# Patient Record
Sex: Male | Born: 1970
Health system: Southern US, Community
[De-identification: ages and names within clinical notes are randomized; demographics above are authoritative.]

## PROBLEM LIST (undated history)

## (undated) DIAGNOSIS — M1990 Unspecified osteoarthritis, unspecified site: Secondary | ICD-10-CM

## (undated) DIAGNOSIS — J301 Allergic rhinitis due to pollen: Secondary | ICD-10-CM

## (undated) DIAGNOSIS — G453 Amaurosis fugax: Secondary | ICD-10-CM

## (undated) DIAGNOSIS — M519 Unspecified thoracic, thoracolumbar and lumbosacral intervertebral disc disorder: Secondary | ICD-10-CM

## (undated) DIAGNOSIS — G4733 Obstructive sleep apnea (adult) (pediatric): Secondary | ICD-10-CM

## (undated) DIAGNOSIS — M722 Plantar fascial fibromatosis: Secondary | ICD-10-CM

## (undated) DIAGNOSIS — M109 Gout, unspecified: Secondary | ICD-10-CM

## (undated) DIAGNOSIS — R5382 Chronic fatigue, unspecified: Secondary | ICD-10-CM

## (undated) DIAGNOSIS — M159 Polyosteoarthritis, unspecified: Secondary | ICD-10-CM

## (undated) DIAGNOSIS — E781 Pure hyperglyceridemia: Secondary | ICD-10-CM

## (undated) HISTORY — DX: Amaurosis fugax: G45.3

## (undated) HISTORY — DX: Unspecified thoracic, thoracolumbar and lumbosacral intervertebral disc disorder: M51.9

## (undated) HISTORY — DX: Chronic fatigue, unspecified: R53.82

## (undated) HISTORY — PX: COLONOSCOPY: SHX174

## (undated) HISTORY — DX: Plantar fascial fibromatosis: M72.2

## (undated) HISTORY — DX: Unspecified osteoarthritis, unspecified site: M19.90

## (undated) HISTORY — DX: Pure hyperglyceridemia: E78.1

## (undated) HISTORY — DX: Allergic rhinitis due to pollen: J30.1

## (undated) HISTORY — DX: Obstructive sleep apnea (adult) (pediatric): G47.33

## (undated) HISTORY — DX: Gout, unspecified: M10.9

## (undated) HISTORY — DX: Polyosteoarthritis, unspecified: M15.9

---

## 1998-06-23 ENCOUNTER — Ambulatory Visit (HOSPITAL_COMMUNITY): Admission: RE | Admit: 1998-06-23 | Discharge: 1998-06-23 | Payer: Self-pay | Admitting: Urology

## 1999-05-24 HISTORY — PX: APPENDECTOMY: SHX54

## 1999-06-03 ENCOUNTER — Ambulatory Visit (HOSPITAL_COMMUNITY): Admission: RE | Admit: 1999-06-03 | Discharge: 1999-06-03 | Payer: Self-pay | Admitting: Family Medicine

## 1999-06-03 ENCOUNTER — Encounter: Payer: Self-pay | Admitting: Family Medicine

## 1999-06-03 ENCOUNTER — Observation Stay (HOSPITAL_COMMUNITY): Admission: EM | Admit: 1999-06-03 | Discharge: 1999-06-04 | Payer: Self-pay | Admitting: *Deleted

## 2000-05-22 ENCOUNTER — Encounter: Admission: RE | Admit: 2000-05-22 | Discharge: 2000-05-22 | Payer: Self-pay | Admitting: Otolaryngology

## 2000-05-22 ENCOUNTER — Encounter: Payer: Self-pay | Admitting: Otolaryngology

## 2008-04-30 ENCOUNTER — Emergency Department (HOSPITAL_COMMUNITY): Admission: EM | Admit: 2008-04-30 | Discharge: 2008-04-30 | Payer: Self-pay | Admitting: Emergency Medicine

## 2011-01-21 ENCOUNTER — Encounter: Payer: Self-pay | Admitting: Gastroenterology

## 2011-02-08 ENCOUNTER — Institutional Professional Consult (permissible substitution) (INDEPENDENT_AMBULATORY_CARE_PROVIDER_SITE_OTHER): Payer: BC Managed Care – PPO | Admitting: Critical Care Medicine

## 2011-02-08 ENCOUNTER — Encounter: Payer: Self-pay | Admitting: Critical Care Medicine

## 2011-02-08 DIAGNOSIS — R5383 Other fatigue: Secondary | ICD-10-CM | POA: Insufficient documentation

## 2011-02-08 DIAGNOSIS — J984 Other disorders of lung: Secondary | ICD-10-CM

## 2011-02-08 DIAGNOSIS — R5381 Other malaise: Secondary | ICD-10-CM | POA: Insufficient documentation

## 2011-02-21 NOTE — Assessment & Plan Note (Signed)
Summary: Pulmonary Consultation   Copy to:  Dr. Candie Echevaria Primary Provider/Referring Provider:  Dr. Candie Echevaria  CC:  Pulmonary Consult - abnormal CT and lung nodule.Marland Kitchen  History of Present Illness: Pulmonary Consultation  40 yo WM referred for eval of 4mm noncalcified nodule RML incidentally seen on Abd CT Done for eval of fatigue and ? panc CA  Pt noted having having fatigue, initially checked for mono and neg.   Pt noted in Jan 2012 he had headaches  worsening that  started in  Dec and extended  into jan. Pt had a CT abdomen and this noted 4mm nodule RML   Pt is  still fatigued.   Pt   had CT head neg and abd.   Pt with 4mm RML nodule seen non calcified There is no real chest pain,  over the years, a pinching effect.  Pt notes exposure hx  : works as a Curator, Sports administrator, exhaust fumes, torch burning and welding , brake dust.    Preventive Screening-Counseling & Management  Alcohol-Tobacco     Smoking Status: never     Passive Smoke Exposure: yes  Comments: mother smoked,   Current Medications (verified): 1)  None  Allergies (verified): 1)  ! Pcn  Past History:  Past medical, surgical, family and social histories (including risk factors) reviewed, and no changes noted (except as noted below).  Past Medical History: FATIGUE (ICD-780.79)  Past Surgical History: Appendectomy  Family History: Reviewed history and no changes required. emphysema - PGF pancreatic cancer - MGM  Social History: Reviewed history and no changes required. Never smoked married 1 daughter Curator Smoking Status:  never Passive Smoke Exposure:  yes  Review of Systems       The patient complains of acid heartburn, loss of appetite, weight change, headaches, nasal congestion/difficulty breathing through nose, and sneezing.  The patient denies shortness of breath with activity, shortness of breath at rest, productive cough, non-productive cough, coughing up blood, chest pain,  irregular heartbeats, indigestion, abdominal pain, difficulty swallowing, sore throat, tooth/dental problems, itching, ear ache, anxiety, depression, hand/feet swelling, joint stiffness or pain, rash, change in color of mucus, and fever.    Vital Signs:  Patient profile:   40 year old male Height:      71 inches Weight:      232 pounds BMI:     32.47 O2 Sat:      98 % on Room air Temp:     98.4 degrees F oral Pulse rate:   73 / minute BP sitting:   110 / 60  (left arm) Cuff size:   regular  Vitals Entered By: Gweneth Dimitri RN (February 08, 2011 3:27 PM)  O2 Flow:  Room air CC: Pulmonary Consult - abnormal CT, lung nodule. Comments Medications reviewed with patient Daytime contact number verified with patient. Gweneth Dimitri RN  February 08, 2011 3:28 PM    Physical Exam  Additional Exam:  Gen: Pleasant, well-nourished, in no distress,  normal affect ENT: No lesions,  mouth clear,  oropharynx clear, no postnasal drip Neck: No JVD, no TMG, no carotid bruits Lungs: No use of accessory muscles, no dullness to percussion, clear without rales or rhonchi Cardiovascular: RRR, heart sounds normal, no murmur or gallops, no peripheral edema Abdomen: soft and NT, no HSM,  BS normal Musculoskeletal: No deformities, no cyanosis or clubbing Neuro: alert, non focal Skin: Warm, no lesions or rashes    CT of Abdomen  Procedure date:  01/19/2011  Findings:      4 mm RML non calcified lung nodule   Impression & Recommendations:  Problem # 1:  PULMONARY NODULE, RIGHT MIDDLE LOBE (ICD-518.89) Assessment Unchanged 4mm noncalcified RML lung nodule of doubtful significance in a nonsmoking 40 yo WM plan no further CT scans needed pulm f/u is as needed  Orders: New Patient Level III (21308)  Patient Instructions: 1)  No further pulmonary follow up is needed    Appended Document: Pulmonary Consultation fax Candie Echevaria

## 2016-04-12 ENCOUNTER — Telehealth: Payer: Self-pay | Admitting: Internal Medicine

## 2016-04-12 NOTE — Telephone Encounter (Signed)
Ok with me, but please let pt know that I do not treat chronic pain, so I would not normally be able to prescribe sched 2 or higher narcotic (but this may not even be an issue for him, I was just letting him know)

## 2016-04-12 NOTE — Telephone Encounter (Signed)
He called to advise that his mother in low, [place], referred you to him as a good dr. He is requesting to be taken under your care. Is this something that you are able to do?

## 2016-04-24 ENCOUNTER — Other Ambulatory Visit (INDEPENDENT_AMBULATORY_CARE_PROVIDER_SITE_OTHER): Payer: BLUE CROSS/BLUE SHIELD

## 2016-04-24 ENCOUNTER — Ambulatory Visit (INDEPENDENT_AMBULATORY_CARE_PROVIDER_SITE_OTHER): Payer: BLUE CROSS/BLUE SHIELD | Admitting: Internal Medicine

## 2016-04-24 ENCOUNTER — Encounter: Payer: Self-pay | Admitting: Internal Medicine

## 2016-04-24 VITALS — BP 122/80 | HR 89 | Temp 98.6°F | Resp 20 | Wt 232.0 lb

## 2016-04-24 DIAGNOSIS — M109 Gout, unspecified: Secondary | ICD-10-CM

## 2016-04-24 DIAGNOSIS — E781 Pure hyperglyceridemia: Secondary | ICD-10-CM | POA: Insufficient documentation

## 2016-04-24 DIAGNOSIS — Z0001 Encounter for general adult medical examination with abnormal findings: Secondary | ICD-10-CM | POA: Diagnosis not present

## 2016-04-24 DIAGNOSIS — M10072 Idiopathic gout, left ankle and foot: Secondary | ICD-10-CM

## 2016-04-24 DIAGNOSIS — R7989 Other specified abnormal findings of blood chemistry: Secondary | ICD-10-CM

## 2016-04-24 DIAGNOSIS — R6889 Other general symptoms and signs: Secondary | ICD-10-CM | POA: Diagnosis not present

## 2016-04-24 DIAGNOSIS — J301 Allergic rhinitis due to pollen: Secondary | ICD-10-CM | POA: Insufficient documentation

## 2016-04-24 DIAGNOSIS — R350 Frequency of micturition: Secondary | ICD-10-CM | POA: Diagnosis not present

## 2016-04-24 DIAGNOSIS — J069 Acute upper respiratory infection, unspecified: Secondary | ICD-10-CM

## 2016-04-24 DIAGNOSIS — M1990 Unspecified osteoarthritis, unspecified site: Secondary | ICD-10-CM | POA: Insufficient documentation

## 2016-04-24 LAB — CBC WITH DIFFERENTIAL/PLATELET
Basophils Absolute: 0.1 10*3/uL (ref 0.0–0.1)
Basophils Relative: 0.3 % (ref 0.0–3.0)
Eosinophils Absolute: 0.3 10*3/uL (ref 0.0–0.7)
Eosinophils Relative: 1.5 % (ref 0.0–5.0)
HCT: 47.6 % (ref 39.0–52.0)
Hemoglobin: 16.7 g/dL (ref 13.0–17.0)
Lymphocytes Relative: 23.4 % (ref 12.0–46.0)
Lymphs Abs: 4.1 10*3/uL — ABNORMAL HIGH (ref 0.7–4.0)
MCHC: 35 g/dL (ref 30.0–36.0)
MCV: 82.9 fl (ref 78.0–100.0)
Monocytes Absolute: 1 10*3/uL (ref 0.1–1.0)
Monocytes Relative: 5.4 % (ref 3.0–12.0)
Neutro Abs: 12.2 10*3/uL — ABNORMAL HIGH (ref 1.4–7.7)
Neutrophils Relative %: 69.4 % (ref 43.0–77.0)
Platelets: 360 10*3/uL (ref 150.0–400.0)
RBC: 5.75 Mil/uL (ref 4.22–5.81)
RDW: 13.2 % (ref 11.5–15.5)
WBC: 17.6 10*3/uL — ABNORMAL HIGH (ref 4.0–10.5)

## 2016-04-24 LAB — URINALYSIS, ROUTINE W REFLEX MICROSCOPIC
Bilirubin Urine: NEGATIVE
Ketones, ur: NEGATIVE
Leukocytes, UA: NEGATIVE
Nitrite: NEGATIVE
Specific Gravity, Urine: >=1.03 — AB (ref 1.000–1.030)
Total Protein, Urine: NEGATIVE
Urine Glucose: NEGATIVE
Urobilinogen, UA: 0.2 (ref 0.0–1.0)
WBC, UA: NONE SEEN (ref 0–?)
pH: 5 (ref 5.0–8.0)

## 2016-04-24 LAB — HEPATIC FUNCTION PANEL
ALT: 23 U/L (ref 0–53)
AST: 17 U/L (ref 0–37)
Albumin: 4.5 g/dL (ref 3.5–5.2)
Alkaline Phosphatase: 65 U/L (ref 39–117)
Bilirubin, Direct: 0.1 mg/dL (ref 0.0–0.3)
Total Bilirubin: 0.5 mg/dL (ref 0.2–1.2)
Total Protein: 8.1 g/dL (ref 6.0–8.3)

## 2016-04-24 LAB — BASIC METABOLIC PANEL
BUN: 16 mg/dL (ref 6–23)
CO2: 25 mEq/L (ref 19–32)
Calcium: 10.1 mg/dL (ref 8.4–10.5)
Chloride: 103 mEq/L (ref 96–112)
Creatinine, Ser: 1.05 mg/dL (ref 0.40–1.50)
GFR: 80.64 mL/min (ref >60.00)
Glucose, Bld: 97 mg/dL (ref 70–99)
Potassium: 4.1 mEq/L (ref 3.5–5.1)
Sodium: 137 mEq/L (ref 135–145)

## 2016-04-24 LAB — LIPID PANEL
Cholesterol: 151 mg/dL (ref 0–200)
HDL: 32.1 mg/dL — ABNORMAL LOW (ref >39.00)
NonHDL: 118.9
Total CHOL/HDL Ratio: 5
Triglycerides: 271 mg/dL — ABNORMAL HIGH (ref 0.0–149.0)
VLDL: 54.2 mg/dL — ABNORMAL HIGH (ref 0.0–40.0)

## 2016-04-24 LAB — URIC ACID: Uric Acid, Serum: 6.7 mg/dL (ref 4.0–7.8)

## 2016-04-24 MED ORDER — PREDNISONE 10 MG PO TABS: ORAL_TABLET | ORAL | Status: DC

## 2016-04-24 MED ORDER — COLCHICINE 0.6 MG PO TABS: 0.6000 mg | ORAL_TABLET | Freq: Every day | ORAL | Status: DC

## 2016-04-24 NOTE — Patient Instructions (Addendum)
You had the steroid shot today  Please take all new medication as prescribed - the prednisone, and the colchicine  Please continue all other medications as before, and refills have been done if requested.  Please have the pharmacy call with any other refills you may need.  Please continue your efforts at being more active, low cholesterol diet, and weight control.  You are otherwise up to date with prevention measures today.  Please keep your appointments with your specialists as you may have planned  Please go to the LAB in the Basement (turn left off the elevator) for the tests to be done today  You will be contacted by phone if any changes need to be made immediately.  Otherwise, you will receive a letter about your results with an explanation, but please check with MyChart first.  Please remember to sign up for MyChart if you have not done so, as this will be important to you in the future with finding out test results, communicating by private email, and scheduling acute appointments online when needed.  You are given the work note today  Please call for urology referral if the urinary symptoms continue and we are unable to find the reason with the testing  Please return in 1 year for your yearly visit, or sooner if needed, with Lab testing done 3-5 days before

## 2016-04-24 NOTE — Progress Notes (Signed)
Subjective:    Patient ID: Michael Hickman, male    DOB: 05/16/1971, 45 y.o.   MRN: [id number]  HPI  Here for wellness and f/u;  Overall doing ok;  Pt denies Chest pain, worsening SOB, DOE, wheezing, orthopnea, PND, worsening LE edema, palpitations, dizziness or syncope.  Pt denies neurological change such as new headache, facial or extremity weakness.  Pt denies polydipsia, polyuria, or low sugar symptoms. Pt states overall good compliance with treatment and medications, good tolerability, and has been trying to follow appropriate diet.  Pt denies worsening depressive symptoms, suicidal ideation or panic. No fever, night sweats, wt loss, loss of appetite, or other constitutional symptoms.  Pt states good ability with ADL's, has low fall risk, home safety reviewed and adequate, no other significant changes in hearing or vision, and occasionally active with exercise.  Incidentally with URI symptoms since last wk, seen at [facility] and tx with antibx but did not take. Still with fever, facial pain, pressure, headache, general weakness and malaise, and greenish d/c, with mild ST and cough.  Also here with left ankle pain/red/swelling start suddenly 4 days ago, similar to previous gout attacks, without fever, trauma.  This is overall 4th episode in 6 yrs.  Also mentions urinary freq, mainly at night, sometimes assoc with showers and brushing teeth, sometime small amounts, sometimes larger. Denies urinary symptoms such as dysuria, urgency, flank pain, hematuria or n/v, fever, chills. Past Medical History  Diagnosis Date  . Arthritis   . Hay fever   . UTI (urinary tract infection)   . Hypertriglyceridemia   . Gout    Past Surgical History  Procedure Laterality Date  . Appendectomy  05/24/1999    reports that he has never smoked. He does not have any smokeless tobacco history on file. He reports that he does not drink alcohol or use illicit drugs. family history includes Multiple sclerosis in his maternal  grandfather; Pancreatic cancer in his maternal grandmother. Allergies  Allergen Reactions  . Penicillins    No current outpatient prescriptions on file prior to visit.   No current facility-administered medications on file prior to visit.   Review of Systems Constitutional: Negative for increased diaphoresis, or other activity, appetite or siginficant weight change other than noted HENT: Negative for worsening hearing loss, ear pain, facial swelling, mouth sores and neck stiffness.   Eyes: Negative for other worsening pain, redness or visual disturbance.  Respiratory: Negative for choking or stridor Cardiovascular: Negative for other chest pain and palpitations.  Gastrointestinal: Negative for worsening diarrhea, blood in stool, or abdominal distention Genitourinary: Negative for hematuria, flank pain or change in urine volume.  Musculoskeletal: Negative for myalgias or other joint complaints.  Skin: Negative for other color change and wound or drainage.  Neurological: Negative for syncope and numbness. other than noted Hematological: Negative for adenopathy. or other swelling Psychiatric/Behavioral: Negative for hallucinations, SI, self-injury, decreased concentration or other worsening agitation.      Objective:   Physical Exam BP 122/80 mmHg  Pulse 89  Temp(Src) 98.6 F (37 C) (Oral)  Resp 20  Wt 232 lb (105.235 kg)  SpO2 95% VS noted, mild ill Constitutional: Pt is oriented to person, place, and time. Appears well-developed and well-nourished, in no significant distress Head: Normocephalic and atraumatic  Eyes: Conjunctivae and EOM are normal. Pupils are equal, round, and reactive to light Right Ear: External ear normal.  Left Ear: External ear normal Nose: Nose normal.  Bilat tm's with mild erythema.  Max sinus  areas non tender.  Pharynx with mild erythema, no exudate Mouth/Throat: Oropharynx is clear and moist  Neck: Normal range of motion. Neck supple. No JVD present.  No tracheal deviation present or significant neck LA or mass Cardiovascular: Normal rate, regular rhythm, normal heart sounds and intact distal pulses.   Pulmonary/Chest: Effort normal and breath sounds without rales or wheezing  Abdominal: Soft. Bowel sounds are normal. NT. No HSM  Musculoskeletal: Normal range of motion. Exhibits no edema. Left ankle with 1-2+ swelling/tender/mild erythema Lymphadenopathy: Has no cervical adenopathy.  Neurological: Pt is alert and oriented to person, place, and time. Pt has normal reflexes. No cranial nerve deficit. Motor grossly intact Skin: Skin is warm and dry. No rash noted or new ulcers Psychiatric:  Has normal mood and affect. Behavior is normal.     Assessment & Plan:

## 2016-04-24 NOTE — Progress Notes (Signed)
Pre visit review using our clinic review tool, if applicable. No additional management support is needed unless otherwise documented below in the visit note. 

## 2016-04-25 ENCOUNTER — Telehealth: Payer: Self-pay | Admitting: Internal Medicine

## 2016-04-25 ENCOUNTER — Encounter: Payer: Self-pay | Admitting: Internal Medicine

## 2016-04-25 DIAGNOSIS — J069 Acute upper respiratory infection, unspecified: Secondary | ICD-10-CM | POA: Insufficient documentation

## 2016-04-25 LAB — PSA: PSA: 0.92 ng/mL (ref 0.10–4.00)

## 2016-04-25 LAB — TSH: TSH: 1.45 u[IU]/mL (ref 0.35–4.50)

## 2016-04-25 LAB — LDL CHOLESTEROL, DIRECT: Direct LDL: 84 mg/dL

## 2016-04-25 MED ORDER — AZITHROMYCIN 250 MG PO TABS: ORAL_TABLET | ORAL | Status: DC

## 2016-04-25 NOTE — Telephone Encounter (Signed)
Please advise can we do this 

## 2016-04-25 NOTE — Assessment & Plan Note (Signed)
Etiology unclear, for glc with labs today, UA, and consider urology referral

## 2016-04-25 NOTE — Telephone Encounter (Signed)
Please follow up in regards with patient on labs.  Returning phone call.

## 2016-04-25 NOTE — Assessment & Plan Note (Signed)
Suspect related to metabolic abnormality related to recurrent gout as well, for lower fat diet, consider fenofibrate with lab done

## 2016-04-25 NOTE — Assessment & Plan Note (Signed)

## 2016-04-25 NOTE — Assessment & Plan Note (Signed)
Mild to mod, for depomedrol IM, predpac asd, declines pain med, for colchicine prn future attack to f/u any worsening symptoms or concerns

## 2016-04-25 NOTE — Telephone Encounter (Signed)
rx for zpack done erx

## 2016-04-25 NOTE — Telephone Encounter (Signed)
Patient called and said he can not find the paper rx for the abx he had got form the other doctor. He would like Dr. Jenny Reichmann to send the abx to the pharmacy [facility] on [address] [place], [place] [address]

## 2016-04-25 NOTE — Assessment & Plan Note (Signed)
Mild to mod, for antibx he already has at home,  to f/u any worsening symptoms or concerns

## 2016-04-25 NOTE — Addendum Note (Signed)
Addended by: Biagio Borg on: 04/25/2016 05:21 PM   Modules accepted: Orders

## 2016-04-26 ENCOUNTER — Telehealth: Payer: Self-pay | Admitting: Internal Medicine

## 2016-04-26 MED ORDER — INDOMETHACIN 50 MG PO CAPS: 50.0000 mg | ORAL_CAPSULE | Freq: Three times a day (TID) | ORAL | Status: DC | PRN

## 2016-04-26 NOTE — Telephone Encounter (Signed)
Work note was already given to patient at time of visit

## 2016-04-26 NOTE — Telephone Encounter (Signed)
Patient would also like a call back on lab results for gout.

## 2016-04-26 NOTE — Telephone Encounter (Signed)
Letter faxed. Results not yet back.

## 2016-04-26 NOTE — Addendum Note (Signed)
Addended by: Biagio Borg on: 04/26/2016 12:51 PM   Modules accepted: Orders, Medications

## 2016-04-26 NOTE — Telephone Encounter (Signed)
error 

## 2016-04-26 NOTE — Telephone Encounter (Signed)
Patient states that Dr. Jenny Reichmann told him to call back if he needed additional days out of work.  Patient is going to return to work on May 1st. Faxed to employer at [phone].  Also, patient states Dr. Jenny Reichmann prescribed him a medication for gout. Patient does not know the name of the medication.  States this medication will cost him 300.00.  Patient is requesting a cheaper med to be sent to [facility] on [address] in [place].

## 2016-04-26 NOTE — Telephone Encounter (Signed)
[place] for note as done  Gout blood test was OK (uric acid normal)  Ok for indocin prn - done erx

## 2016-04-26 NOTE — Telephone Encounter (Signed)
Pt called in and said that dr Jenny Reichmann said he would write him a note to be out of work from [employer].   [phone] - fax number  [place] [facility]

## 2018-02-10 ENCOUNTER — Ambulatory Visit (INDEPENDENT_AMBULATORY_CARE_PROVIDER_SITE_OTHER): Payer: BLUE CROSS/BLUE SHIELD | Admitting: Internal Medicine

## 2018-02-10 ENCOUNTER — Other Ambulatory Visit (INDEPENDENT_AMBULATORY_CARE_PROVIDER_SITE_OTHER): Payer: BLUE CROSS/BLUE SHIELD

## 2018-02-10 ENCOUNTER — Encounter: Payer: Self-pay | Admitting: Internal Medicine

## 2018-02-10 ENCOUNTER — Ambulatory Visit (INDEPENDENT_AMBULATORY_CARE_PROVIDER_SITE_OTHER)
Admission: RE | Admit: 2018-02-10 | Discharge: 2018-02-10 | Disposition: A | Discharge status: Home / Self Care | Payer: BLUE CROSS/BLUE SHIELD | Source: Ambulatory Visit | Attending: Internal Medicine | Admitting: Internal Medicine

## 2018-02-10 VITALS — BP 116/78 | HR 84 | Temp 98.2°F | Ht 71.0 in | Wt 235.0 lb

## 2018-02-10 DIAGNOSIS — Z114 Encounter for screening for human immunodeficiency virus [HIV]: Secondary | ICD-10-CM | POA: Diagnosis not present

## 2018-02-10 DIAGNOSIS — R002 Palpitations: Secondary | ICD-10-CM

## 2018-02-10 DIAGNOSIS — G4733 Obstructive sleep apnea (adult) (pediatric): Secondary | ICD-10-CM | POA: Diagnosis not present

## 2018-02-10 DIAGNOSIS — L750 Bromhidrosis: Secondary | ICD-10-CM | POA: Diagnosis not present

## 2018-02-10 DIAGNOSIS — R8290 Unspecified abnormal findings in urine: Secondary | ICD-10-CM

## 2018-02-10 DIAGNOSIS — M519 Unspecified thoracic, thoracolumbar and lumbosacral intervertebral disc disorder: Secondary | ICD-10-CM

## 2018-02-10 DIAGNOSIS — Z0001 Encounter for general adult medical examination with abnormal findings: Secondary | ICD-10-CM | POA: Diagnosis not present

## 2018-02-10 HISTORY — DX: Unspecified thoracic, thoracolumbar and lumbosacral intervertebral disc disorder: M51.9

## 2018-02-10 LAB — BASIC METABOLIC PANEL
BUN: 22 mg/dL (ref 6–23)
CO2: 25 mEq/L (ref 19–32)
Calcium: 9.4 mg/dL (ref 8.4–10.5)
Chloride: 103 mEq/L (ref 96–112)
Creatinine, Ser: 1.06 mg/dL (ref 0.40–1.50)
GFR: 79.15 mL/min (ref >60.00)
Glucose, Bld: 98 mg/dL (ref 70–99)
Potassium: 3.7 mEq/L (ref 3.5–5.1)
Sodium: 139 mEq/L (ref 135–145)

## 2018-02-10 LAB — URINALYSIS, ROUTINE W REFLEX MICROSCOPIC
Bilirubin Urine: NEGATIVE
Ketones, ur: NEGATIVE
Leukocytes, UA: NEGATIVE
Nitrite: NEGATIVE
Specific Gravity, Urine: >=1.03 — AB (ref 1.000–1.030)
Total Protein, Urine: NEGATIVE
Urine Glucose: NEGATIVE
Urobilinogen, UA: 0.2 (ref 0.0–1.0)
WBC, UA: NONE SEEN (ref 0–?)
pH: 5.5 (ref 5.0–8.0)

## 2018-02-10 NOTE — Assessment & Plan Note (Addendum)
ecg reviewed, etiology unclear, pt declines labs today for this,  to f/u any worsening symptoms or concerns  In addition to the time spent performing CPE, I spent an additional 15 minutes face to face,in which greater than 50% of this time was spent in counseling and coordination of care for patient's illness as documented, including the differential dx, treatment, further evaluation and other management of palpitations, urine odor and OSA

## 2018-02-10 NOTE — Assessment & Plan Note (Signed)
Mild, for UA, BMP,  to f/u any worsening symptoms or concerns

## 2018-02-10 NOTE — Patient Instructions (Signed)
Your EKG was OK today  Please continue all other medications as before, and refills have been done if requested.  Please have the pharmacy call with any other refills you may need.  Please continue your efforts at being more active, low cholesterol diet, and weight control.  You are otherwise up to date with prevention measures today.  You will be contacted regarding the referral for: pulmonary  Please keep your appointments with your specialists as you may have planned  Please go to the XRAY Department in the Basement (go straight as you get off the elevator) for the x-ray testing  Please go to the LAB in the Basement (turn left off the elevator) for the tests to be done today  You will be contacted by phone if any changes need to be made immediately.  Otherwise, you will receive a letter about your results with an explanation, but please check with MyChart first.  Please remember to sign up for MyChart if you have not done so, as this will be important to you in the future with finding out test results, communicating by private email, and scheduling acute appointments online when needed.  Please return in 1 year for your yearly visit, or sooner if needed, with Lab testing done 3-5 days before

## 2018-02-10 NOTE — Assessment & Plan Note (Signed)
With possible related symptoms of sob, palp and dizziness ?  For pulm referral

## 2018-02-10 NOTE — Progress Notes (Signed)
Subjective:    Patient ID: Michael Hickman, male    DOB: 1971-04-07, 47 y.o.   MRN: [id number]  HPI Here for wellness and f/u;  Overall doing ok;  Pt denies Chest pain, worsening SOB, DOE, wheezing, orthopnea, PND, worsening LE edema, dizziness or syncope.  Pt denies neurological change such as new headache, facial or extremity weakness.  Pt denies polydipsia, polyuria, or low sugar symptoms. Pt states overall good compliance with treatment and medications, good tolerability, and has been trying to follow appropriate diet.  Pt denies worsening depressive symptoms, suicidal ideation or panic. No fever, night sweats, wt loss, loss of appetite, or other constitutional symptoms.  Pt states good ability with ADL's, has low fall risk, home safety reviewed and adequate, no other significant changes in hearing or vision, and occasionally active with exercise, but not often as he conts to work as Conservation officer, nature.   Also c/o persistent difficulty tolerating CPAP and quit, but willing to restart with new pulmonary.  Also has 2-3 wks night time sweats, palpiations and sob at night for unclear reasons, intermittent, nothing makes better or worse.  Also c/o several days worsening urinary odor, but Denies urinary symptoms such as dysuria, frequency, urgency, flank pain, hematuria or n/v, fever, chills.  Has "full labs" being done next mo per work MD, so does not want labs unless necessary Past Medical History:  Diagnosis Date  . Arthritis   . Gout   . Hay fever   . Hypertriglyceridemia   . Lumbar disc disease 02/10/2018  . UTI (urinary tract infection)    Past Surgical History:  Procedure Laterality Date  . APPENDECTOMY  05/24/1999    reports that  has never smoked. he has never used smokeless tobacco. He reports that he does not drink alcohol or use drugs. family history includes Multiple sclerosis in his maternal grandfather; Pancreatic cancer in his maternal grandmother. Allergies  Allergen Reactions    . Penicillins    No current outpatient medications on file prior to visit.   No current facility-administered medications on file prior to visit.    Review of Systems Constitutional: Negative for other unusual diaphoresis, sweats, appetite or weight changes HENT: Negative for other worsening hearing loss, ear pain, facial swelling, mouth sores or neck stiffness.   Eyes: Negative for other worsening pain, redness or other visual disturbance.  Respiratory: Negative for other stridor or swelling Cardiovascular: Negative for other palpitations or other chest pain  Gastrointestinal: Negative for worsening diarrhea or loose stools, blood in stool, distention or other pain Genitourinary: Negative for hematuria, flank pain or other change in urine volume.  Musculoskeletal: Negative for myalgias or other joint swelling.  Skin: Negative for other color change, or other wound or worsening drainage.  Neurological: Negative for other syncope or numbness. Hematological: Negative for other adenopathy or swelling Psychiatric/Behavioral: Negative for hallucinations, other worsening agitation, SI, self-injury, or new decreased concentration All other system neg per pt    Objective:   Physical Exam BP 116/78   Pulse 84   Temp 98.2 F (36.8 C) (Oral)   Ht 5\' 11"  (1.803 m)   Wt 235 lb (106.6 kg)   SpO2 97%   BMI 32.78 kg/m  VS noted,  Constitutional: Pt is oriented to person, place, and time. Appears well-developed and well-nourished, in no significant distress and comfortable Head: Normocephalic and atraumatic  Eyes: Conjunctivae and EOM are normal. Pupils are equal, round, and reactive to light Right Ear: External ear normal without discharge Left  Ear: External ear normal without discharge Nose: Nose without discharge or deformity Mouth/Throat: Oropharynx is without other ulcerations and moist  Neck: Normal range of motion. Neck supple. No JVD present. No tracheal deviation present or  significant neck LA or mass Cardiovascular: Normal rate, regular rhythm, normal heart sounds and intact distal pulses.   Pulmonary/Chest: WOB normal and breath sounds without rales or wheezing  Abdominal: Soft. Bowel sounds are normal. NT. No HSM  Musculoskeletal: Normal range of motion. Exhibits no edema Lymphadenopathy: Has no other cervical adenopathy.  Neurological: Pt is alert and oriented to person, place, and time. Pt has normal reflexes. No cranial nerve deficit. Motor grossly intact, Gait intact Skin: Skin is warm and dry. No rash noted or new ulcerations Psychiatric:  Has normal mood and affect. Behavior is normal without agitation No other exam findings  ECG today I have personaly interpreted: NSR without ischemia    Assessment & Plan:

## 2018-02-10 NOTE — Assessment & Plan Note (Signed)

## 2018-02-11 ENCOUNTER — Encounter: Payer: Self-pay | Admitting: Internal Medicine

## 2018-02-11 LAB — HIV ANTIBODY (ROUTINE TESTING W REFLEX): HIV 1&2 Ab, 4th Generation: NONREACTIVE

## 2018-04-09 ENCOUNTER — Ambulatory Visit (INDEPENDENT_AMBULATORY_CARE_PROVIDER_SITE_OTHER): Payer: BLUE CROSS/BLUE SHIELD | Admitting: Pulmonary Disease

## 2018-04-09 ENCOUNTER — Encounter: Payer: Self-pay | Admitting: Pulmonary Disease

## 2018-04-09 VITALS — BP 120/80 | HR 84 | Ht 71.0 in | Wt 235.8 lb

## 2018-04-09 DIAGNOSIS — G4733 Obstructive sleep apnea (adult) (pediatric): Secondary | ICD-10-CM

## 2018-04-09 NOTE — Progress Notes (Signed)
   Subjective:    Patient ID: Michael Hickman, male    DOB: May 08, 1971, 47 y.o.   MRN: [id number]  HPI    Review of Systems  Constitutional: Negative for fever and unexpected weight change.  HENT: Positive for sinus pressure. Negative for congestion, dental problem, ear pain, nosebleeds, postnasal drip, rhinorrhea, sneezing, sore throat and trouble swallowing.   Eyes: Negative for redness and itching.  Respiratory: Negative for cough, chest tightness, shortness of breath and wheezing.   Cardiovascular: Negative for palpitations and leg swelling.  Gastrointestinal: Negative for nausea and vomiting.  Genitourinary: Negative for dysuria.  Musculoskeletal: Negative for joint swelling.  Skin: Negative for rash.  Allergic/Immunologic: Positive for environmental allergies. Negative for food allergies and immunocompromised state.  Neurological: Negative for headaches.  Hematological: Does not bruise/bleed easily.  Psychiatric/Behavioral: Negative for dysphoric mood. The patient is not nervous/anxious.        Objective:   Physical Exam        Assessment & Plan:

## 2018-04-09 NOTE — Patient Instructions (Signed)
Will arrange for home sleep study Will call to arrange for follow up after sleep study reviewed  

## 2018-04-09 NOTE — Progress Notes (Signed)
[facility]  Chief Complaint  Patient presents with  . Consult    Sleep consult, woke up gasping for air and with night sweats x3 months ago     Vital signs: BP 120/80 (BP Location: Left Arm, Cuff Size: Normal)   Pulse 84   Ht 5\' 11"  (1.803 m)   Wt 235 lb 12.8 oz (107 kg)   SpO2 97%   BMI 32.89 kg/m   History of Present Illness: Michael Hickman is a 47 y.o. male for evaluation of sleep problems.  His wife has been concerned about his snoring.  He will also stop breathing while asleep.  He has woken up feeling like he is choked, and gets sweaty at night.  His wife is a Copywriter, advertising.  He had an oral appliance made, but couldn't tolerate this.  He then saw pulmonary with [facility] last year.  Had sleep study that showed AHI of 6.  He was started on CPAP.  He never got mask or pressure setting adjusted.  He got frustrated with lack of responsiveness, and turned CPAP in.  His sleep issues have persisted, and he decided to reassess.  He goes to sleep at 9 pm.  He falls asleep 10 minutes.  He wakes up some times to use the bathroom.  He gets out of bed at 6 am.  He feels tired in the morning.  He denies morning headache.  He does not use anything to help him fall sleep or stay awake.  He denies sleep walking, sleep talking, bruxism, or nightmares.  There is no history of restless legs.  He denies sleep hallucinations, sleep paralysis, or cataplexy.  The Epworth score is 5 out of 24.    Physical Exam:  General - pleasant Eyes - pupils reactive ENT - no sinus tenderness, no oral exudate, no LAN, MP 3, low laying soft palate Cardiac - regular, no murmur Chest - no wheeze, rales Abd - soft, non tender Ext - no edema Skin - no rashes Neuro - normal strength Psych - normal mood  Discussion: He has snoring, sleep disruption, apnea, and daytime sleepiness.  He has prior sleep study showing mild sleep apnea, but he had trouble adjusting to CPAP.   His symptoms have progressed.  I suspect he still has sleep apnea.  We discussed how sleep apnea can affect various health problems, including risks for hypertension, cardiovascular disease, and diabetes.  We also discussed how sleep disruption can increase risks for accidents, such as while driving.  Weight loss as a means of improving sleep apnea was also reviewed.  Additional treatment options discussed were CPAP therapy, oral appliance, and surgical intervention.  Assessment/Plan:  Obstructive sleep apnea. - will repeat home sleep study to assess current status - will then likely arrange for CPAP titration in sleep lab to assess optimal pressure setting and help with mask fit - he was intolerant of previous attempt with oral appliance   Patient Instructions  Will arrange for home sleep study Will call to arrange for follow up after sleep study reviewed    Chesley Mires, MD [place] 04/09/2018, 5:28 PM  Flow Sheet  Sleep tests:  Review of Systems: Constitutional: Negative for fever and unexpected weight change.  HENT: Positive for sinus pressure. Negative for congestion, dental problem, ear pain, nosebleeds, postnasal drip, rhinorrhea, sneezing, sore throat and trouble swallowing.   Eyes: Negative for redness and itching.  Respiratory: Negative for cough, chest tightness, shortness of  breath and wheezing.   Cardiovascular: Negative for palpitations and leg swelling.  Gastrointestinal: Negative for nausea and vomiting.  Genitourinary: Negative for dysuria.  Musculoskeletal: Negative for joint swelling.  Skin: Negative for rash.  Allergic/Immunologic: Positive for environmental allergies. Negative for food allergies and immunocompromised state.  Neurological: Negative for headaches.  Hematological: Does not bruise/bleed easily.  Psychiatric/Behavioral: Negative for dysphoric mood. The patient is not nervous/anxious.    Past Medical History: He  has a past  medical history of Arthritis, Gout, Hay fever, Hypertriglyceridemia, Lumbar disc disease (02/10/2018), and UTI (urinary tract infection).  Past Surgical History: He  has a past surgical history that includes Appendectomy (05/24/1999).  Family History: His family history includes Multiple sclerosis in his maternal grandfather; Pancreatic cancer in his maternal grandmother.  Social History: He  reports that he has never smoked. He has never used smokeless tobacco. He reports that he does not drink alcohol or use drugs.  Medications: Allergies as of 04/09/2018      Reactions   Penicillins       Medication List    as of 04/09/2018  5:28 PM   You have not been prescribed any medications.

## 2018-04-28 DIAGNOSIS — G4733 Obstructive sleep apnea (adult) (pediatric): Secondary | ICD-10-CM | POA: Diagnosis not present

## 2018-04-30 ENCOUNTER — Other Ambulatory Visit: Payer: Self-pay | Admitting: *Deleted

## 2018-04-30 DIAGNOSIS — G4733 Obstructive sleep apnea (adult) (pediatric): Secondary | ICD-10-CM

## 2018-05-01 DIAGNOSIS — G4733 Obstructive sleep apnea (adult) (pediatric): Secondary | ICD-10-CM | POA: Diagnosis not present

## 2018-05-02 ENCOUNTER — Telehealth: Payer: Self-pay | Admitting: Pulmonary Disease

## 2018-05-02 DIAGNOSIS — G4733 Obstructive sleep apnea (adult) (pediatric): Secondary | ICD-10-CM

## 2018-05-02 NOTE — Telephone Encounter (Signed)
HST 04/28/18 >> AHI 16.9, SaO2 low 83%   Will have my nurse inform pt that sleep study shows moderate sleep apnea.  Options are 1) CPAP now, 2) ROV first.  If He is agreeable to CPAP, then please send order for auto CPAP range 5 to 15 cm H2O with heated humidity and mask of choice.  Have download sent 1 month after starting CPAP and set up ROV 2 months after starting CPAP.  ROV can be with me or NP.

## 2018-05-05 NOTE — Telephone Encounter (Signed)
Called and spoke with patient regarding results.  Informed the patient of results and recommendations today. Pt verbalized understanding and denied any questions or concerns at this time.  Patient is agreeable to CPAP, placed order today for auto CPAP range 5 to 15 cm H2O with heated humidity and mask of choice.  Have download sent 1 month after starting CPAP and set up ROV 2 months after starting CPAP.   Nothing further needed.

## 2018-09-30 HISTORY — PX: TRANSTHORACIC ECHOCARDIOGRAM: SHX275

## 2018-10-15 ENCOUNTER — Encounter: Payer: Self-pay | Admitting: Internal Medicine

## 2018-10-15 ENCOUNTER — Ambulatory Visit: Payer: BLUE CROSS/BLUE SHIELD | Admitting: Internal Medicine

## 2018-10-15 VITALS — BP 122/78 | HR 87 | Temp 98.5°F | Ht 71.0 in | Wt 237.0 lb

## 2018-10-15 DIAGNOSIS — G4733 Obstructive sleep apnea (adult) (pediatric): Secondary | ICD-10-CM | POA: Diagnosis not present

## 2018-10-15 DIAGNOSIS — H539 Unspecified visual disturbance: Secondary | ICD-10-CM

## 2018-10-15 NOTE — Progress Notes (Signed)
Subjective:    Patient ID: Michael Hickman, male    DOB: [birth year], 47 y.o.   MRN: [id number]  HPI   Here after ED visit per [facility] facility, then a [facility] facility, then a further opthalmologist as pt stated he woke up middle of night 10/14-10/15/2019 with sudden onset complete vision loss in right eye that spontaneously resolved after 15-20 seconds without residual; no pain or flashes of light in either eye; exercise/exertion does not affect acuity; referred him on to other optho with impression:  (partial report here only)  NAME: Michael Hickman DOB: 09-16-1971  IMPRESSION:  1. Transient monocular vision loss OD, recovering spontaneously after 20 seconds on 10/13/2018. High on the differential is vasospasm vs atypical orthostasis, though his history of sleep apnea may be a contributor. Though there is mild prolongation of arm-to-retina circulation time OD, there was no defect seen on studies in ER and there is good pulsatile blood flow on plethysmography. There are no symptoms of Giant Cell Arteritis and his Sed Rate and CRP in ER were normal. He has a paternal history of migraines.  2. Mild lens opacity OU.  PLAN: From an ophthalmic standpoint: he needs no further follow up unless there are new symptoms.  From a systemic standpoint: he is to discuss this with Dr. Jenny Hickman, who he sees tomorrow, to see if they can shed some light on the differential of vasospasm/migraine, orthostasis and sleep apnea.   ________________  Past Retinal Procedures: None here  OCT of the Macula:  Images were ordered by Dr. Tollie Pizza, obtained, stored in patients medical record, and interpreted as showing: liquified vitreous pocket over the macula though attached at disc and elsewhere OU  Fluorescein Angiogram:  Images were ordered by Dr. Tollie Pizza, obtained, stored in patients medical record, and interpreted as showing: mild prolongation arm-to-retina circulation time (26) seconds with normal arteriovenous  transit and perfusion OD; otherwise normal vasculature without leakage OU  Fundus Photography: Images were ordered by Dr. Tollie Pizza, obtained, stored in patients medical record, and interpreted as consistent with the clinical exam of the fundus described in this note.  Pt today without further vision change, HA, fever, ST, cough, and Pt denies chest pain, increased sob or doe, wheezing, orthopnea, PND, increased LE swelling, palpitations, dizziness or syncope. Past Medical History:  Diagnosis Date  . Arthritis   . Gout   . Hay fever   . Hypertriglyceridemia   . Lumbar disc disease 02/10/2018  . UTI (urinary tract infection)    Past Surgical History:  Procedure Laterality Date  . APPENDECTOMY  05/24/1999    reports that he has never smoked. He has never used smokeless tobacco. He reports that he does not drink alcohol or use drugs. family history includes Multiple sclerosis in his maternal grandfather; Pancreatic cancer in his maternal grandmother. Allergies  Allergen Reactions  . Penicillins    No current outpatient medications on file prior to visit.   No current facility-administered medications on file prior to visit.    Review of Systems  Constitutional: Negative for other unusual diaphoresis or sweats HENT: Negative for ear discharge or swelling Eyes: Negative for other worsening visual disturbances Respiratory: Negative for stridor or other swelling  Gastrointestinal: Negative for worsening distension or other blood Genitourinary: Negative for retention or other urinary change Musculoskeletal: Negative for other MSK pain or swelling Skin: Negative for color change or other new lesions Neurological: Negative for worsening tremors and other numbness  Psychiatric/Behavioral: Negative for worsening agitation or other fatigue  All other system neg per pt    Objective:   Physical Exam BP 122/78   Pulse 87   Temp 98.5 F (36.9 C) (Oral)   Ht 5\' 11"  (1.803 m)   Wt 237 lb  (107.5 kg)   SpO2 95%   BMI 33.05 kg/m  VS noted,  Constitutional: Pt appears in NAD HENT: Head: NCAT.  Right Ear: External ear normal.  Left Ear: External ear normal.  Eyes: . Pupils are equal, round, and reactive to light. Conjunctivae and EOM are normal Nose: without d/c or deformity Neck: Neck supple. Gross normal ROM Cardiovascular: Normal rate and regular rhythm.   Pulmonary/Chest: Effort normal and breath sounds without rales or wheezing.  Neurological: Pt is alert. At baseline orientation, motor grossly intact Skin: Skin is warm. No rashes, other new lesions, no LE edema Psychiatric: Pt behavior is normal without agitation  No other exam findings Lab Results  Component Value Date   WBC 17.6 (H) 04/24/2016   HGB 16.7 04/24/2016   HCT 47.6 04/24/2016   PLT 360.0 04/24/2016   GLUCOSE 98 02/10/2018   CHOL 151 04/24/2016   TRIG 271.0 (H) 04/24/2016   HDL 32.10 (L) 04/24/2016   LDLDIRECT 84.0 04/24/2016   ALT 23 04/24/2016   AST 17 04/24/2016   NA 139 02/10/2018   K 3.7 02/10/2018   CL 103 02/10/2018   CREATININE 1.06 02/10/2018   BUN 22 02/10/2018   CO2 25 02/10/2018   TSH 1.45 04/24/2016   PSA 0.92 04/24/2016        Assessment & Plan:

## 2018-10-15 NOTE — Assessment & Plan Note (Signed)
Etiology unclear, not felt to be consistent with amaurosis fugax per specialty optho, denies further symptoms including vision, dizziness.  Has not had CPAP compliance and was frustrated over lack of responsiveness to his request for titration follow up; pt to continue asa 81 mg, also for echo

## 2018-10-15 NOTE — Assessment & Plan Note (Signed)
Declines further f/u for eval and tx for now

## 2018-10-15 NOTE — Patient Instructions (Addendum)
Please continue all other medications as before, including the aspirin 81 mg per day  Please have the pharmacy call with any other refills you may need.  Please continue your efforts at being more active, low cholesterol diet, and weight control.  You are otherwise up to date with prevention measures today.  Please keep your appointments with your specialists as you may have planned  You will be contacted regarding the referral for: Echocardiogram

## 2018-10-17 ENCOUNTER — Inpatient Hospital Stay: Payer: BLUE CROSS/BLUE SHIELD | Admitting: Internal Medicine

## 2018-10-23 ENCOUNTER — Ambulatory Visit (HOSPITAL_COMMUNITY): Payer: BLUE CROSS/BLUE SHIELD | Attending: Internal Medicine

## 2018-10-23 ENCOUNTER — Other Ambulatory Visit: Payer: Self-pay

## 2018-10-23 DIAGNOSIS — H539 Unspecified visual disturbance: Secondary | ICD-10-CM

## 2018-10-25 ENCOUNTER — Encounter: Payer: Self-pay | Admitting: Internal Medicine

## 2018-10-27 ENCOUNTER — Telehealth: Payer: Self-pay

## 2018-10-27 NOTE — Telephone Encounter (Signed)
Pt has been informed of results and expressed understanding.  He would like to know what is the next step to finding out what ever it is he has going on because he is still having symptoms. Please advise.

## 2018-10-27 NOTE — Telephone Encounter (Signed)
-----   Message from Biagio Borg, MD sent at 10/25/2018  3:00 AM EDT ----- [place] Baseman- ok to let pt know by phone his Echo had no significant abnormalities;  Letter to follow when I return Nov 4

## 2018-10-29 NOTE — Telephone Encounter (Signed)
I can refer to neurology if he wants, otherwise I would not have anything further to offer

## 2018-10-29 NOTE — Telephone Encounter (Signed)
Pt has been informed of results and expressed understanding.  He doesn't want to proceed with the referral at this time but if he changes his mind he will call back.

## 2018-11-21 ENCOUNTER — Telehealth: Payer: Self-pay | Admitting: Family Medicine

## 2018-11-21 NOTE — Telephone Encounter (Signed)
Patient is requesting to transfer care from Dr Jenny Reichmann to Dr Anitra Lauth (see reason below). Dr Jenny Reichmann, is this okay with you? Dr. Anitra Lauth, is this okay with you?

## 2018-11-21 NOTE — Telephone Encounter (Signed)
Ok with me 

## 2018-11-21 NOTE — Telephone Encounter (Signed)
Copied from [place] [id number]. Topic: Appointment Scheduling - Transfer of Care >> Nov 21, 2018  3:28 PM Reyne Dumas L wrote: Pt is requesting to transfer FROM: Dr. Jenny Reichmann Pt is requesting to transfer TO: Dr. Anitra Lauth Reason for requested transfer: unhappy with care from Dr. Jenny Reichmann, no call backs from questions, and [facility] is closer location.  Pt's wife calling requesting transfer for pt.  Pt's contact number is [phone]  Send CRM to patient's current PCP (transferring FROM).

## 2018-11-26 NOTE — Telephone Encounter (Signed)
I have reviewed patient's chart. Pt has been seen multiple times over the last year by [facility] by Dr. Jenny Reichmann. He has established relationships with 2 different primary care offices.  I decline to accept him as a patient.

## 2018-11-26 NOTE — Telephone Encounter (Signed)
Patient's wife called to make sure that her husband's care has been transferred from Dr. Jenny Reichmann to Dr. Anitra Lauth.  She stated that her husband is still not feeling well and they have not heard anything as to how soon she could see Dr. Anitra Lauth.  Please advise and let patient know as soon as possible.

## 2018-12-01 ENCOUNTER — Encounter: Payer: Self-pay | Admitting: Family Medicine

## 2018-12-01 NOTE — Telephone Encounter (Signed)
I spoke with Mr. Alpern at length regarding his request to change providers today. He expressed frustration with the communication breakdown based on phone notes from Oct 30th. He states that he was never offered a referral to neurology and no one had called him with that information. He also states he has seen [facility] for urgent care issues and cold like symptoms but he would very willingly agree to have all of his care coordinated through [facility] - if you would be willing to accept him. 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

## 2018-12-01 NOTE — Telephone Encounter (Signed)
OK. Ok to transfer to me. -thx

## 2018-12-02 NOTE — Telephone Encounter (Signed)
Patient has been scheduled 12/17/18.

## 2018-12-02 NOTE — Telephone Encounter (Signed)
Michael Hickman,  Will you call to schedule Michael Hickman please? Thank you!

## 2018-12-17 ENCOUNTER — Encounter: Payer: BLUE CROSS/BLUE SHIELD | Admitting: Family Medicine

## 2018-12-19 ENCOUNTER — Encounter: Payer: Self-pay | Admitting: Family Medicine

## 2018-12-19 ENCOUNTER — Ambulatory Visit: Payer: BLUE CROSS/BLUE SHIELD | Admitting: Family Medicine

## 2018-12-19 ENCOUNTER — Encounter

## 2018-12-19 VITALS — BP 116/76 | HR 97 | Temp 98.8°F | Resp 16 | Ht 71.0 in | Wt 239.1 lb

## 2018-12-19 DIAGNOSIS — E669 Obesity, unspecified: Secondary | ICD-10-CM | POA: Diagnosis not present

## 2018-12-19 DIAGNOSIS — R7989 Other specified abnormal findings of blood chemistry: Secondary | ICD-10-CM | POA: Diagnosis not present

## 2018-12-19 DIAGNOSIS — G4733 Obstructive sleep apnea (adult) (pediatric): Secondary | ICD-10-CM

## 2018-12-19 DIAGNOSIS — R5382 Chronic fatigue, unspecified: Secondary | ICD-10-CM | POA: Diagnosis not present

## 2018-12-19 DIAGNOSIS — H34231 Retinal artery branch occlusion, right eye: Secondary | ICD-10-CM

## 2018-12-19 DIAGNOSIS — H5460 Unqualified visual loss, one eye, unspecified: Secondary | ICD-10-CM

## 2018-12-19 DIAGNOSIS — R6882 Decreased libido: Secondary | ICD-10-CM

## 2018-12-19 NOTE — Progress Notes (Addendum)
Office Note 12/21/2018  CC:  Chief Complaint  Patient presents with  . Transfer of Care    from Dr. Jenny Reichmann  . Follow-up    ER visit for vision loss in right, went to [facility]    HPI:  Michael Hickman is a 47 y.o.  male who is here to establish care, transferring from Dr. Jenny Reichmann at [facility] location.  This location is more convenient to their home. Patient's most recent primary MD: Dr. Jenny Reichmann. Old records in EPIC/[facility] EMR were reviewed prior to or during today's visit.  Six months ago, he started having intermittent periods of (about 1 a week). Was having night sweats, gets anxious about the feeling, can't catch his breath.  Sometimes the sequence is such that he wakes up first with SOB and then panics and starts sweating.   Sleeping in recliner sometimes has been required.    About 2 mo ago, on one occasion he could not see out of R eye at all, had no pain--woke up out of sleep with this in the middle of the night. Slowly the light/vision came back within about 30 sec.  He went to the hospital that night (10/14/18) ([facility] in [place]). He got a w/u for stroke: neg and was d/c'd from the ED w/out being admitted.  Reviewed records today--> Labs all wnl except TSH was just above ULN at 5.11.  CT angio head and neck showed "There is no cervical or intracranial arterial stenosis, dissection or occlusion. There is no aneurysm or vascular malformation."  CT head w/ou contrast showed no acute intracranial abnormality. EKG was normal. He was started on an aspirin qd but he did not start taking this med.  He notes that he felt the best he had in a long time about 2 d after getting out of hospital. He was told by ED MD to get an echo and to see an eye MD. He went to [facility] [place] on 10/16/1 (Dr. Trilby Leaver Hidaji) and was sent to another eye doctor b/c of suspicion of branch retinal artery occlusion of right eye.  He was immediately referred to a retinal specialist in [place] named Meade Maw.  A dye test was done and the flow through a branch retinal artery was borderline slow but there was no defect seen on studies in ER and there is good pulsatile blood flow on plethysmography.  No sx's of Giant Cell Arteritis and his sed rate and CRP in ER were normal.  Mild lens opacity OU.  The ophthalmologist's note states "from an ophthalmic standpoint: he needs no further follow up unless there are new symptoms".  There was mention of vasospasm, ocular migraine, orthostasis, and sleep apnea possibly being etiologies of his sx's. Echo normal on 10/23/18.  He has had no recurrence of the vision loss. He presented to his PCP, Dr. Jenny Reichmann, for f/u.  He was reassured and apparently told nothing new to offer, proceed with watchful waiting approach.  However, patient states he was to be called back with a plan for the next step but never received a call.  There is documentation in EMR from 10/27/18 that Dr. Jenny Reichmann could refer him to a neurologist, but pt chose to defer this at that time.  Pt states today he does not recall that and says he would have "jumped at the opportunity to see a specialist".  ROS: no CP, no SOB, no wheezing, no cough, no dizziness, no HAs, no rashes, no melena/hematochezia.  No polyuria or polydipsia.  No myalgias or arthralgias. No periods of being unaware. Arms fall asleep easy at night.    Past Medical History:  Diagnosis Date  . Arthritis    knees and back and neck  . Chronic fatigue   . Gout   . Hay fever   . Hypertriglyceridemia   . Lumbar disc disease 02/10/2018  . OSA (obstructive sleep apnea)    CPAP in the past but he could not tolerate this; eventually pt gave up on the process.  After 2nd sleep study, he could not afford the CPAP.    Past Surgical History:  Procedure Laterality Date  . APPENDECTOMY  05/24/1999  . TRANSTHORACIC ECHOCARDIOGRAM  09/2018   EF 55-60%, NO ABNORMALITIES    Family History  Problem Relation Age of Onset  . Pancreatic cancer  Maternal Grandmother   . Multiple sclerosis Maternal Grandfather     Social History   Socioeconomic History  . Marital status: Married    Spouse name: Not on file  . Number of children: Not on file  . Years of education: Not on file  . Highest education level: Not on file  Occupational History  . Not on file  Social Needs  . Financial resource strain: Not on file  . Food insecurity:    Worry: Not on file    Inability: Not on file  . Transportation needs:    Medical: Not on file    Non-medical: Not on file  Tobacco Use  . Smoking status: Never Smoker  . Smokeless tobacco: Never Used  Substance and Sexual Activity  . Alcohol use: No    Alcohol/week: 0.0 standard drinks  . Drug use: No  . Sexual activity: Not on file  Lifestyle  . Physical activity:    Days per week: Not on file    Minutes per session: Not on file  . Stress: Not on file  Relationships  . Social connections:    Talks on phone: Not on file    Gets together: Not on file    Attends religious service: Not on file    Active member of club or organization: Not on file    Attends meetings of clubs or organizations: Not on file    Relationship status: Not on file  . Intimate partner violence:    Fear of current or ex partner: Not on file    Emotionally abused: Not on file    Physically abused: Not on file    Forced sexual activity: Not on file  Other Topics Concern  . Not on file  Social History Narrative   Married to [place], 1 daughter.   Orig from [place].   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   No tob.   No alc.     MEDS: none  Allergies  Allergen Reactions  . Penicillins     ROS Review of Systems  Constitutional: Positive for fatigue. Negative for fever.  HENT: Negative for congestion and sore throat.   Eyes: Negative for visual disturbance.  Respiratory: Negative for cough.   Cardiovascular: Negative for chest pain.  Gastrointestinal: Negative for abdominal pain, anal bleeding,  blood in stool, constipation, diarrhea, nausea, rectal pain and vomiting.  Genitourinary: Negative for dysuria, frequency and urgency.       +Decreased Libido  Says no ED.  Musculoskeletal: Negative for back pain and joint swelling.  Skin: Negative for rash.  Neurological: Negative for seizures, weakness, light-headedness and headaches.  Hematological: Negative for adenopathy.  Psychiatric/Behavioral: Negative for confusion, dysphoric mood, sleep disturbance and suicidal ideas. The patient is not nervous/anxious.     PE; Blood pressure 116/76, pulse 97, temperature 98.8 F (37.1 C), temperature source Oral, resp. rate 16, height '5\' 11"'  (1.803 m), weight 239 lb 2 oz (108.5 kg), SpO2 96 %. Body mass index is 33.35 kg/m.  Gen: Alert, well appearing.  Patient is oriented to person, place, time, and situation. AFFECT: pleasant, lucid thought and speech. TSV:XBLT: no injection, icteris, swelling, or exudate.  EOMI, PERRLA. Mouth: lips without lesion/swelling.  Oral mucosa pink and moist. Oropharynx without erythema, exudate, or swelling.  Neck - No masses or thyromegaly or limitation in range of motion CV: RRR, no m/r/g.   LUNGS: CTA bilat, nonlabored resps, good aeration in all lung fields. EXT: no clubbing or cyanosis.  no edema.  Neuro: CN 2-12 intact bilaterally, strength 5/5 in proximal and distal upper extremities and lower extremities bilaterally.  No sensory deficits.  No tremor.  No disdiadochokinesis.  No ataxia.  Upper extremity and lower extremity DTRs symmetric.  No pronator drift. Visual field testing normal.  No nystagmus.   Pertinent labs:     Chemistry      Component Value Date/Time   NA 139 02/10/2018 1644   K 3.7 02/10/2018 1644   CL 103 02/10/2018 1644   CO2 25 02/10/2018 1644   BUN 22 02/10/2018 1644   CREATININE 1.06 02/10/2018 1644      Component Value Date/Time   CALCIUM 9.4 02/10/2018 1644   ALKPHOS 65 04/24/2016 1704   AST 17 04/24/2016 1704   ALT 23  04/24/2016 1704   BILITOT 0.5 04/24/2016 1704     Lab Results  Component Value Date   WBC 17.6 (H) 04/24/2016   HGB 16.7 04/24/2016   HCT 47.6 04/24/2016   MCV 82.9 04/24/2016   PLT 360.0 04/24/2016   Lab Results  Component Value Date   TSH 1.45 04/24/2016   Lab Results  Component Value Date   CHOL 151 04/24/2016   HDL 32.10 (L) 04/24/2016   LDLDIRECT 84.0 04/24/2016   TRIG 271.0 (H) 04/24/2016   CHOLHDL 5 04/24/2016    ASSESSMENT AND PLAN:   New/transfer pt:  1) Transient monocular (OD) vision loss.  Throrough blood and imaging workup unrevealing except a possible right branch retinal artery occlusion on initial ophthalmologist exam.  F/u exam by retinal specialist the next day shows NO sign of any obstruction of blood flow/thrombosis.   Vasospasm, ocular migraine, simple partial seizure, atypical effect of untreated OSA?   I told him to start taking 81 mg ASA qd and I'll review his records again in detail.  If no new info revealed, will refer him to a neurologist.  In the meantime, will obtain 48H holter monitor to try to capture asymptomatic a-fib. Will also recheck a lab panel: CBC w/diff, CMET, TSH, T4, T3, ESR, CRP, testosterone total and free, LH, FSH, cortisol, and prolactin levels. I won't check hypercoagulability studies at this time.  2) Mildly elevated TSH: likely of NO clinical significance.  However, with his low libido and potential for low testosterone level, will repeat this level and T4 and T3, along with other pituitary blood tests.  3) OSA, untreated.  Difficult to tell whether or not this is even effecting him-->he is fairly evasive about this subject.  He gives not indication that he will ever pursue trial of CPAP again.  Fortunately he currently is doing well, just feeling very fatigued.  Spent  60 min with pt today, with >50% of this time spent in counseling and care coordination regarding the above problems.  An After Visit Summary was printed and  given to the patient.  Return in about 10 days (around 12/29/2018) for 30 min f/u R eye vision loss.  Signed:  Crissie Sickles, MD           12/21/2018

## 2018-12-21 ENCOUNTER — Encounter: Payer: Self-pay | Admitting: Family Medicine

## 2018-12-21 ENCOUNTER — Telehealth: Payer: Self-pay | Admitting: Family Medicine

## 2018-12-21 NOTE — Addendum Note (Signed)
Addended by: Tammi Sou on: 12/21/2018 06:50 PM   Modules accepted: Orders

## 2018-12-21 NOTE — Telephone Encounter (Signed)
Chart thoroughly reviewed. No additional imaging is indicated at this time. I do want him to get 48 hour continuous monitoring of his heart rhythm to make sure an abnormal rhythm is not causing high risk of blood clots.  I have ordered this 48 hr holter and he'll be getting contacted about it. As long as all his blood tests are unrevealing, I will refer him to a neurologist for further evaluation.  We'll wait until all blood tests are back before ordering this referral.-thx

## 2018-12-22 ENCOUNTER — Other Ambulatory Visit (INDEPENDENT_AMBULATORY_CARE_PROVIDER_SITE_OTHER): Payer: BLUE CROSS/BLUE SHIELD

## 2018-12-22 DIAGNOSIS — R5382 Chronic fatigue, unspecified: Secondary | ICD-10-CM

## 2018-12-22 DIAGNOSIS — R6882 Decreased libido: Secondary | ICD-10-CM | POA: Diagnosis not present

## 2018-12-22 DIAGNOSIS — R7989 Other specified abnormal findings of blood chemistry: Secondary | ICD-10-CM

## 2018-12-22 DIAGNOSIS — H5460 Unqualified visual loss, one eye, unspecified: Secondary | ICD-10-CM

## 2018-12-22 LAB — CBC WITH DIFFERENTIAL/PLATELET
Basophils Absolute: 0.1 10*3/uL (ref 0.0–0.1)
Basophils Relative: 0.9 % (ref 0.0–3.0)
Eosinophils Absolute: 0.2 10*3/uL (ref 0.0–0.7)
Eosinophils Relative: 1.9 % (ref 0.0–5.0)
HCT: 45.5 % (ref 39.0–52.0)
Hemoglobin: 15.8 g/dL (ref 13.0–17.0)
Lymphocytes Relative: 33.9 % (ref 12.0–46.0)
Lymphs Abs: 2.9 10*3/uL (ref 0.7–4.0)
MCHC: 34.6 g/dL (ref 30.0–36.0)
MCV: 84.9 fl (ref 78.0–100.0)
Monocytes Absolute: 0.5 10*3/uL (ref 0.1–1.0)
Monocytes Relative: 6.2 % (ref 3.0–12.0)
Neutro Abs: 5 10*3/uL (ref 1.4–7.7)
Neutrophils Relative %: 57.1 % (ref 43.0–77.0)
Platelets: 266 10*3/uL (ref 150.0–400.0)
RBC: 5.37 Mil/uL (ref 4.22–5.81)
RDW: 13.5 % (ref 11.5–15.5)
WBC: 8.7 10*3/uL (ref 4.0–10.5)

## 2018-12-22 LAB — COMPREHENSIVE METABOLIC PANEL
ALT: 29 U/L (ref 0–53)
AST: 18 U/L (ref 0–37)
Albumin: 4.2 g/dL (ref 3.5–5.2)
Alkaline Phosphatase: 54 U/L (ref 39–117)
BUN: 15 mg/dL (ref 6–23)
CO2: 25 mEq/L (ref 19–32)
Calcium: 9.2 mg/dL (ref 8.4–10.5)
Chloride: 107 mEq/L (ref 96–112)
Creatinine, Ser: 1.03 mg/dL (ref 0.40–1.50)
GFR: 81.52 mL/min (ref >60.00)
Glucose, Bld: 92 mg/dL (ref 70–99)
Potassium: 4.3 mEq/L (ref 3.5–5.1)
Sodium: 139 mEq/L (ref 135–145)
Total Bilirubin: 0.6 mg/dL (ref 0.2–1.2)
Total Protein: 7.2 g/dL (ref 6.0–8.3)

## 2018-12-22 LAB — C-REACTIVE PROTEIN: CRP: 0.2 mg/dL — ABNORMAL LOW (ref 0.5–20.0)

## 2018-12-22 LAB — TSH: TSH: 1.65 u[IU]/mL (ref 0.35–4.50)

## 2018-12-22 LAB — PROTIME-INR
INR: 1 ratio (ref 0.8–1.0)
Prothrombin Time: 11.8 s (ref 9.6–13.1)

## 2018-12-22 LAB — LUTEINIZING HORMONE: LH: 2.67 m[IU]/mL (ref 1.50–9.30)

## 2018-12-22 LAB — SEDIMENTATION RATE: Sed Rate: 15 mm/hr (ref 0–15)

## 2018-12-22 LAB — FOLLICLE STIMULATING HORMONE: FSH: 2.7 m[IU]/mL (ref 1.4–18.1)

## 2018-12-22 LAB — T4, FREE: Free T4: 0.86 ng/dL (ref 0.60–1.60)

## 2018-12-22 NOTE — Telephone Encounter (Signed)
Pt advised and voiced understanding.   

## 2018-12-23 LAB — RPR: RPR Ser Ql: NONREACTIVE

## 2018-12-23 LAB — HIV ANTIBODY (ROUTINE TESTING W REFLEX): HIV 1&2 Ab, 4th Generation: NONREACTIVE

## 2018-12-23 LAB — TESTOSTERONE TOTAL,FREE,BIO, MALES
Albumin: 4.1 g/dL (ref 3.6–5.1)
Sex Hormone Binding: 32 nmol/L (ref 10–50)
Testosterone, Bioavailable: 116.9 ng/dL (ref >=110.0)
Testosterone, Free: 62.1 pg/mL (ref 46.0–224.0)
Testosterone: 444 ng/dL (ref 250–827)

## 2018-12-23 LAB — T3: T3, Total: 174 ng/dL (ref 76–181)

## 2018-12-23 LAB — PROLACTIN: Prolactin: 5.5 ng/mL (ref 2.0–18.0)

## 2018-12-24 ENCOUNTER — Other Ambulatory Visit: Payer: Self-pay | Admitting: Family Medicine

## 2018-12-24 DIAGNOSIS — H53131 Sudden visual loss, right eye: Secondary | ICD-10-CM

## 2018-12-24 DIAGNOSIS — H539 Unspecified visual disturbance: Secondary | ICD-10-CM

## 2018-12-24 NOTE — Progress Notes (Signed)
Orders only

## 2018-12-25 ENCOUNTER — Encounter: Payer: Self-pay | Admitting: Neurology

## 2018-12-30 ENCOUNTER — Encounter: Payer: Self-pay | Admitting: Family Medicine

## 2018-12-30 ENCOUNTER — Ambulatory Visit: Payer: BLUE CROSS/BLUE SHIELD | Admitting: Family Medicine

## 2018-12-30 VITALS — BP 96/61 | HR 74 | Temp 98.5°F | Resp 16 | Ht 71.0 in | Wt 238.2 lb

## 2018-12-30 DIAGNOSIS — H539 Unspecified visual disturbance: Secondary | ICD-10-CM | POA: Diagnosis not present

## 2018-12-30 NOTE — Progress Notes (Signed)
OFFICE VISIT  12/30/2018   CC:  Chief Complaint  Patient presents with  . Follow-up    vision loss in right eye     HPI:    Patient is a 47 y.o. Caucasian male who presents for 9 day f/u transient R eye vision loss. Last visit I referred him to a neurologist and did a panel of blood tests, all of which were entirely normal (CBC, CMET, TSH, T4 and T3, ESR, CRP, testosterone, LH, cortisol, and prolactin. I also ordered a 48 H holter monitor.  Interim hx: No recurrence of vision abnormality and no new symptom.  His only complaint is still some mild chronic fatigue. He has appt in March with a neurologist. He is in the midst of setting up the Holter monitor.   Past Medical History:  Diagnosis Date  . Arthritis    knees and back and neck  . Chronic fatigue   . Gout   . Hay fever   . Hypertriglyceridemia   . Lumbar disc disease 02/10/2018  . OSA (obstructive sleep apnea)    CPAP in the past but he could not tolerate this; eventually pt gave up on the process.  After 2nd sleep study, he could not afford the CPAP.    Past Surgical History:  Procedure Laterality Date  . APPENDECTOMY  05/24/1999  . COLONOSCOPY  ? approx 2017   part of w/u for abd pain per pt's best recollection (Dr. Mann?)-->? polyps?  pt cannot recall any details  . TRANSTHORACIC ECHOCARDIOGRAM  09/2018   EF 55-60%, NO ABNORMALITIES    Outpatient Medications Prior to Visit  Medication Sig Dispense Refill  . aspirin EC 81 MG tablet Take 81 mg by mouth daily.     No facility-administered medications prior to visit.     Allergies  Allergen Reactions  . Penicillins     ROS As per HPI  PE: Blood pressure 96/61, pulse 74, temperature 98.5 F (36.9 C), temperature source Oral, resp. rate 16, height '5\' 11"'  (1.803 m), weight 238 lb 4 oz (108.1 kg), SpO2 94 %. Gen: Alert, well appearing.  Patient is oriented to person, place, time, and situation. AFFECT: pleasant, lucid thought and speech. No further  exam today.  LABS:  None today  Lab Results  Component Value Date   TSH 1.65 12/22/2018   Lab Results  Component Value Date   WBC 8.7 12/22/2018   HGB 15.8 12/22/2018   HCT 45.5 12/22/2018   MCV 84.9 12/22/2018   PLT 266.0 12/22/2018   Lab Results  Component Value Date   CREATININE 1.03 12/22/2018   BUN 15 12/22/2018   NA 139 12/22/2018   K 4.3 12/22/2018   CL 107 12/22/2018   CO2 25 12/22/2018   Lab Results  Component Value Date   ALT 29 12/22/2018   AST 18 12/22/2018   ALKPHOS 54 12/22/2018   BILITOT 0.6 12/22/2018   Lab Results  Component Value Date   ESRSEDRATE 15 12/22/2018   Lab Results  Component Value Date   CRP 0.2 (L) 12/22/2018   Lab Results  Component Value Date   TESTOSTERONE 444 12/22/2018    IMPRESSION AND PLAN:  Transient R eye vision loss. Question of branch retinal artery occlusion when seen by ophthalmologist.  Retinal specialist saw him and saw NO abnormality, though. No recurrence of symptoms. Next step is eval by neurologist, pt has appt in March 2020. Also, 48H holter to be set up any time now--pt has to call them  back. Signs/symptoms to call or return for were reviewed and pt expressed understanding.  Spent 15 min with pt today, with >50% of this time spent in counseling and care coordination regarding the above problems.  An After Visit Summary was printed and given to the patient.  FOLLOW UP: Return for to be determined based on result of w/u and neuro eval.  Signed:  Crissie Sickles, MD           12/30/2018

## 2018-12-31 DIAGNOSIS — G453 Amaurosis fugax: Secondary | ICD-10-CM

## 2018-12-31 HISTORY — DX: Amaurosis fugax: G45.3

## 2018-12-31 HISTORY — PX: OTHER SURGICAL HISTORY: SHX169

## 2019-01-14 ENCOUNTER — Other Ambulatory Visit: Payer: Self-pay | Admitting: Family Medicine

## 2019-01-14 ENCOUNTER — Ambulatory Visit (INDEPENDENT_AMBULATORY_CARE_PROVIDER_SITE_OTHER): Payer: BLUE CROSS/BLUE SHIELD

## 2019-01-14 DIAGNOSIS — H34231 Retinal artery branch occlusion, right eye: Secondary | ICD-10-CM

## 2019-01-14 DIAGNOSIS — R5383 Other fatigue: Secondary | ICD-10-CM

## 2019-01-14 DIAGNOSIS — R0602 Shortness of breath: Secondary | ICD-10-CM

## 2019-01-14 DIAGNOSIS — H5460 Unqualified visual loss, one eye, unspecified: Secondary | ICD-10-CM

## 2019-01-14 DIAGNOSIS — I4891 Unspecified atrial fibrillation: Secondary | ICD-10-CM

## 2019-01-21 ENCOUNTER — Encounter: Payer: Self-pay | Admitting: Family Medicine

## 2019-01-23 ENCOUNTER — Telehealth: Payer: Self-pay | Admitting: Pulmonary Disease

## 2019-01-23 DIAGNOSIS — G4733 Obstructive sleep apnea (adult) (pediatric): Secondary | ICD-10-CM

## 2019-01-23 NOTE — Telephone Encounter (Signed)
Patient returned call, CB is [phone].

## 2019-01-23 NOTE — Telephone Encounter (Signed)
I called and spoke with the patient and advise that we could place the order but we are uncertain if they may require a new sleep test but we would try. He verbalized understanding   I have placed but nothing further is needed at this time.

## 2019-01-23 NOTE — Telephone Encounter (Signed)
Pt is calling back [phone]

## 2019-01-23 NOTE — Telephone Encounter (Signed)
The order was sent on 05/18/2018 and I believe he will have to start the process over again because insurance may not pay for the CPAP. ATC pt, no answer. Left message for pt to call back.

## 2019-01-23 NOTE — Telephone Encounter (Signed)
[email or web address]

## 2019-02-27 NOTE — Progress Notes (Signed)
NEUROLOGY CONSULTATION NOTE  PER BEAGLEY MRN: [id number] DOB: 02-07-1971  Referring provider: Shawnie Dapper, MD Primary care provider: Shawnie Dapper, MD  Reason for consult:  Transient right monocular vision loss  HISTORY OF PRESENT ILLNESS: Michael Hickman is a 48 year old right-handed Caucasian man with obstructive sleep apnea who presents for transient right monocular vision loss.  History supplemented by ED and referring provider notes.  On the morning of 10/14/2018, patient woke up and sat up in bed.  He felt something wrong but could not describe it.  When he opened his eyes he had complete vision loss in the right eye that lasted 10 to 15 seconds and vision gradually returned.  He denied associated ocular pain, positive visual phenomena, headache, dizziness, slurred speech, facial droop or unilateral numbness or weakness.  He went to the ED at [facility] where CT of head was negative for acute abnormality.  CTA of the head and neck revealed no emergent large vessel occlusion or other vascular abnormality.  ANA negative, sed rate 6, CRP 2.90.  TSH was mildly elevated at 5.11.  He was discharged on ASA 325mg  daily.  He was evaluated by ophthalmology who found mild prolongation of arm-to-retina circulation time in right eye, but overall eye exam was unremarkable with good pulsatile flow on plethysmography.  Vasospasm versus atypical orthostasis or related to uncontrolled OSA were suspected.  Echocardiogram from 10/23/18 was negative with EF 55-60%.  48 hour holter monitor in January 2020 demonstrated no arrhythmia.  He has since discontinued the aspirin.  No recurrence episodes since then.  He feels sluggish.  Once in a while he might see a brief speck of light in his peripheral vision.    He denies history of migraines.    PAST MEDICAL HISTORY: Past Medical History:  Diagnosis Date  . Arthritis    knees and back and neck  . Chronic fatigue   . Gout    . Hay fever   . Hypertriglyceridemia   . Lumbar disc disease 02/10/2018  . OSA (obstructive sleep apnea)    CPAP in the past but he could not tolerate this; eventually pt gave up on the process.  After 2nd sleep study, he could not afford the CPAP.    PAST SURGICAL HISTORY: Past Surgical History:  Procedure Laterality Date  . APPENDECTOMY  05/24/1999  . COLONOSCOPY  ? approx 2017   part of w/u for abd pain per pt's best recollection (Dr. Mann?)-->? polyps?  pt cannot recall any details  . HOLTER MONITOR 48H  12/2018   NORMAL  . TRANSTHORACIC ECHOCARDIOGRAM  09/2018   EF 55-60%, NO ABNORMALITIES    MEDICATIONS: Current Outpatient Medications on File Prior to Visit  Medication Sig Dispense Refill  . aspirin EC 81 MG tablet Take 81 mg by mouth daily.     No current facility-administered medications on file prior to visit.     ALLERGIES: Allergies  Allergen Reactions  . Penicillins     FAMILY HISTORY: Family History  Problem Relation Age of Onset  . Pancreatic cancer Maternal Grandmother   . Multiple sclerosis Maternal Grandfather    SOCIAL HISTORY: Social History   Socioeconomic History  . Marital status: Married    Spouse name: Not on file  . Number of children: Not on file  . Years of education: Not on file  . Highest education level: Not on file  Occupational History  . Not on file  Social Needs  . Financial resource strain:  Not on file  . Food insecurity:    Worry: Not on file    Inability: Not on file  . Transportation needs:    Medical: Not on file    Non-medical: Not on file  Tobacco Use  . Smoking status: Never Smoker  . Smokeless tobacco: Never Used  Substance and Sexual Activity  . Alcohol use: No    Alcohol/week: 0.0 standard drinks  . Drug use: No  . Sexual activity: Not on file  Lifestyle  . Physical activity:    Days per week: Not on file    Minutes per session: Not on file  . Stress: Not on file  Relationships  . Social  connections:    Talks on phone: Not on file    Gets together: Not on file    Attends religious service: Not on file    Active member of club or organization: Not on file    Attends meetings of clubs or organizations: Not on file    Relationship status: Not on file  . Intimate partner violence:    Fear of current or ex partner: Not on file    Emotionally abused: Not on file    Physically abused: Not on file    Forced sexual activity: Not on file  Other Topics Concern  . Not on file  Social History Narrative   Married to [place], 1 daughter.   Orig from [place].   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   No tob.   No alc.      REVIEW OF SYSTEMS: Constitutional: No fevers, chills, or sweats, no generalized fatigue, change in appetite Eyes: No visual changes, double vision, eye pain Ear, nose and throat: No hearing loss, ear pain, nasal congestion, sore throat Cardiovascular: No chest pain, palpitations Respiratory:  No shortness of breath at rest or with exertion, wheezes GastrointestinaI: No nausea, vomiting, diarrhea, abdominal pain, fecal incontinence Genitourinary:  No dysuria, urinary retention or frequency Musculoskeletal:  No neck pain, back pain Integumentary: No rash, pruritus, skin lesions Neurological: as above Psychiatric: No depression, insomnia, anxiety Endocrine: No palpitations, fatigue, diaphoresis, mood swings, change in appetite, change in weight, increased thirst Hematologic/Lymphatic:  No purpura, petechiae. Allergic/Immunologic: no itchy/runny eyes, nasal congestion, recent allergic reactions, rashes  PHYSICAL EXAM: Blood pressure 110/64, pulse 99, height 5\' 11"  (1.803 m), weight 243 lb (110.2 kg), SpO2 93 %. General: No acute distress.  Patient appears well-groomed.   Head:  Normocephalic/atraumatic Eyes:  fundi examined but not visualized Neck: supple, no paraspinal tenderness, full range of motion Back: No paraspinal tenderness Heart: regular rate  and rhythm Lungs: Clear to auscultation bilaterally. Vascular: No carotid bruits. Neurological Exam: Mental status: alert and oriented to person, place, and time, recent and remote memory intact, fund of knowledge intact, attention and concentration intact, speech fluent and not dysarthric, language intact. Cranial nerves: CN I: not tested CN II: pupils equal, round and reactive to light, visual fields intact CN III, IV, VI:  full range of motion, no nystagmus, no ptosis CN V: facial sensation intact CN VII: upper and lower face symmetric CN VIII: hearing intact CN IX, X: gag intact, uvula midline CN XI: sternocleidomastoid and trapezius muscles intact CN XII: tongue midline Bulk & Tone: normal, no fasciculations. Motor:  5/5 throughout  Sensation: temperature and vibration sensation intact.   Deep Tendon Reflexes:  2+ throughout, toes downgoing.   Finger to nose testing:  Without dysmetria.   Heel to shin:  Without dysmetria.   Gait:  Normal station and stride.  Able to turn.  Romberg negative.  IMPRESSION: 1.  Amaurosis fugax of the right eye.  I have no other explanation.  He has no history of migraines, no associated headache or positive visual phenomena to suggest ocular migraine.  OSA is a risk factor for stroke.    PLAN: 1.  Recommend taking aspirin 81mg  daily 2.  Management of sleep apnea 3.  Follow up as needed.  Thank you for allowing me to take part in the care of this patient.  Metta Clines, DO  CC: Shawnie Dapper, MD

## 2019-03-02 ENCOUNTER — Encounter: Payer: Self-pay | Admitting: Neurology

## 2019-03-02 ENCOUNTER — Ambulatory Visit: Payer: BLUE CROSS/BLUE SHIELD | Admitting: Neurology

## 2019-03-02 VITALS — BP 110/64 | HR 99 | Ht 71.0 in | Wt 243.0 lb

## 2019-03-02 DIAGNOSIS — G453 Amaurosis fugax: Secondary | ICD-10-CM

## 2019-03-02 NOTE — Patient Instructions (Signed)
I think you had a small blood clot to the eye. Recommend taking aspirin 81mg  daily Follow up as needed

## 2019-04-05 ENCOUNTER — Encounter: Payer: Self-pay | Admitting: Family Medicine

## 2019-08-23 IMAGING — DX DG CHEST 2V
2 series · 2 of 2 positions shown · non-contrast
Comparison: 05/08/2010 thoracic spine

CLINICAL DATA: night sweats with palp and sob nightly, CPAP non
compliant

EXAM:
CHEST  2 VIEW

[chest pa]
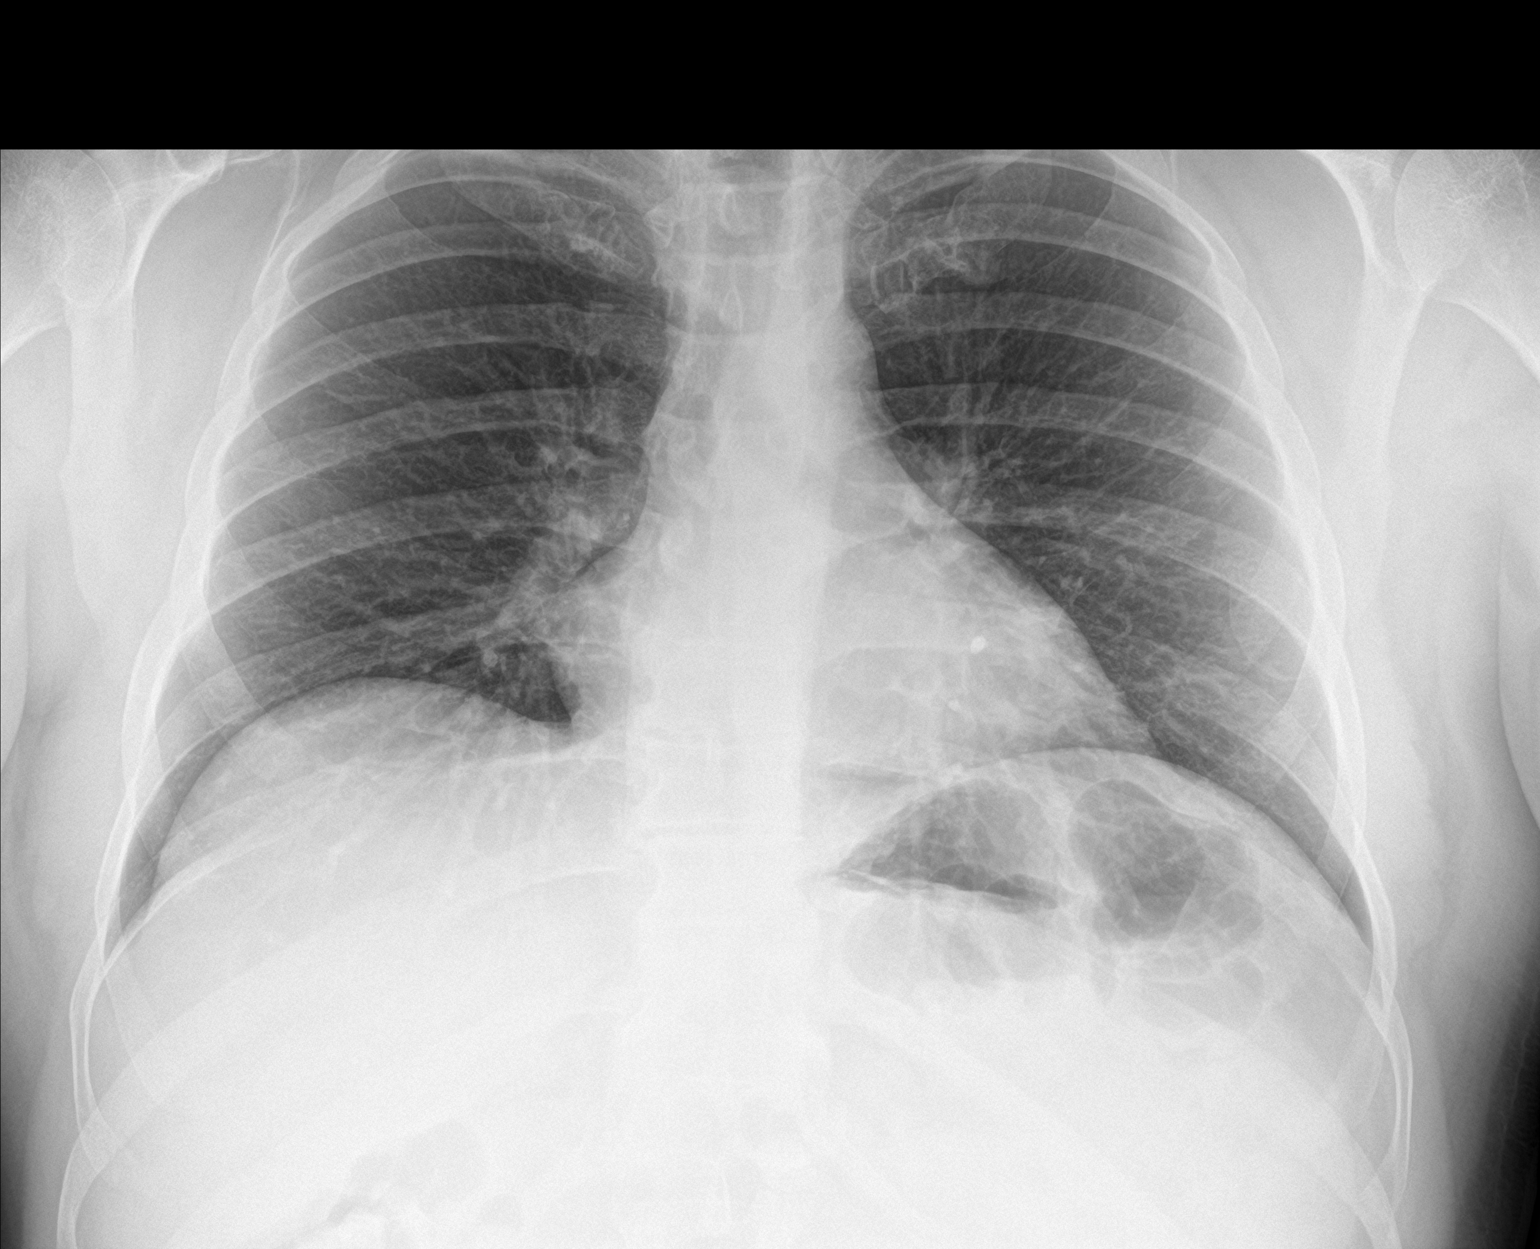

[chest lat]
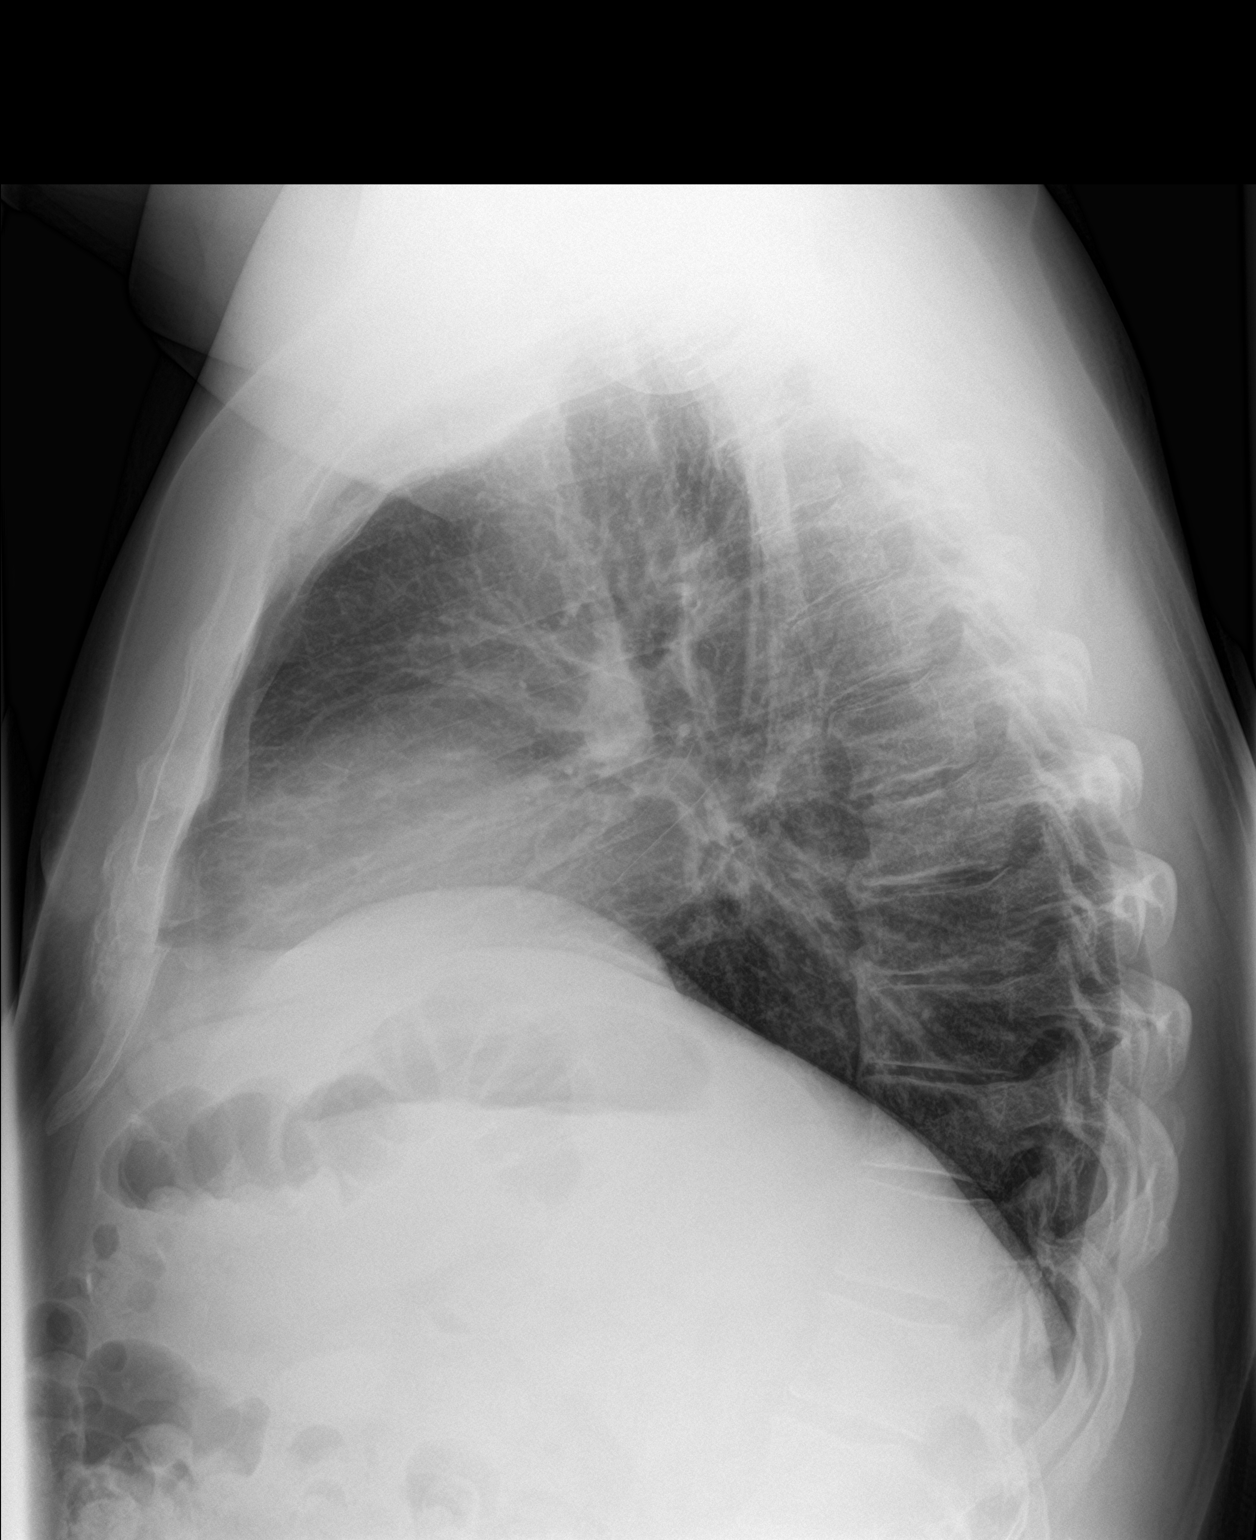

[2 of 2 positions shown; findings below may reference images not displayed]

FINDINGS: Normal heart size. Lungs clear. No pneumothorax. No pleural
effusion.
IMPRESSION: No active cardiopulmonary disease.

## 2019-12-11 ENCOUNTER — Other Ambulatory Visit: Payer: Self-pay

## 2019-12-11 DIAGNOSIS — Z20822 Contact with and (suspected) exposure to covid-19: Secondary | ICD-10-CM

## 2019-12-12 LAB — NOVEL CORONAVIRUS, NAA: SARS-CoV-2, NAA: NOT DETECTED

## 2020-08-05 ENCOUNTER — Other Ambulatory Visit: Payer: Self-pay

## 2020-08-08 ENCOUNTER — Encounter: Payer: Self-pay | Admitting: Family Medicine

## 2020-08-08 ENCOUNTER — Ambulatory Visit (INDEPENDENT_AMBULATORY_CARE_PROVIDER_SITE_OTHER): Payer: 59 | Admitting: Family Medicine

## 2020-08-08 ENCOUNTER — Other Ambulatory Visit: Payer: Self-pay

## 2020-08-08 VITALS — BP 116/81 | HR 94 | Resp 18 | Ht 71.0 in | Wt 234.6 lb

## 2020-08-08 DIAGNOSIS — E669 Obesity, unspecified: Secondary | ICD-10-CM

## 2020-08-08 DIAGNOSIS — Z125 Encounter for screening for malignant neoplasm of prostate: Secondary | ICD-10-CM | POA: Diagnosis not present

## 2020-08-08 DIAGNOSIS — R5382 Chronic fatigue, unspecified: Secondary | ICD-10-CM

## 2020-08-08 DIAGNOSIS — R6882 Decreased libido: Secondary | ICD-10-CM | POA: Diagnosis not present

## 2020-08-08 NOTE — Progress Notes (Signed)
OFFICE VISIT  08/08/2020   CC:  Chief Complaint  Patient presents with  . Fatigue    onset: gradual , severe fatigue .    HPI:    Patient is a 49 y.o.  male with hx of about 3 mo of fatigue, OSA, osteoarthritis of multiple sites, and gout who presents for Excessive sleepiness biggest problem.  Hits him about mid day most of the time.  Some days a lot worse than others.  No signif pain as a part of this.  No HAs. Has had signif dec libido, occ erectile prob but nothing persistent. Has hx of OSA, got frustrated b/c he could not get "anyone to adjust it" so he was noncompliant. Historically he would wake up gasping but says this has not been much of a problem lately. He goes to bed about 9 pm, usually no problem getting to sleep, some nighttime awakening but no prolonged awakening.  Up at 6 pm. No otc meds. No alc or drug use. 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  ROS: no fevers, no CP, no SOB, no wheezing, no cough, no dizziness, no HAs, no rashes, no melena/hematochezia.  No polyuria or polydipsia.  No myalgias or arthralgias.  No focal weakness, paresthesias, or tremors.  No acute vision or hearing abnormalities. No n/v/d or abd pain.  No palpitations.    Denies depression or anxiety.   Pt also requests prostate ca screening today.  He declines rectal exam.  Past Medical History:  Diagnosis Date  . Amaurosis fugax of right eye 2020   Transient monocular vision loss--brief.  W/u unremarkable.  Neurologist rec'd 55m ASA qd and good control of OSA.  .Marland KitchenArthritis    knees and back and neck  . Chronic fatigue   . Gout   . Hay fever   . Hypertriglyceridemia   . Lumbar disc disease 02/10/2018  . OSA (obstructive sleep apnea)    CPAP in the past but he could not tolerate this; eventually pt gave up on the process.  After 2nd sleep study, he could not afford the CPAP.      Past Surgical History:  Procedure Laterality Date  . APPENDECTOMY  05/24/1999  .  COLONOSCOPY  ? approx 2017   part of w/u for abd pain per pt's best recollection (Dr. Mann?)-->? polyps?  pt cannot recall any details  . HOLTER MONITOR 48H  12/2018   NORMAL  . TRANSTHORACIC ECHOCARDIOGRAM  09/2018   EF 55-60%, NO ABNORMALITIES   Social History   Socioeconomic History  . Marital status: Married    Spouse name: MValaria Good . Number of children: 1  . Years of education: Not on file  . Highest education level: Associate degree: occupational, tHotel manager or vocational program  Occupational History  . Occupation: mMusic therapist [employer]  Tobacco Use  . Smoking status: Never Smoker  . Smokeless tobacco: Never Used  Vaping Use  . Vaping Use: Never used  Substance and Sexual Activity  . Alcohol use: No    Alcohol/week: 0.0 standard drinks  . Drug use: No  . Sexual activity: Not on file  Other Topics Concern  . Not on file  Social History Narrative   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   No tob.   No alc.        Patient is right-handed. He lives with his wife  in a two level home. He drinks one cup of coffee, and one large glass of tea a day, he does not exercise.   Social Determinants of Health   Financial Resource Strain:   . Difficulty of Paying Living Expenses:   Food Insecurity:   . Worried About Charity fundraiser in the Last Year:   . Arboriculturist in the Last Year:   Transportation Needs:   . Film/video editor (Medical):   Marland Kitchen Lack of Transportation (Non-Medical):   Physical Activity:   . Days of Exercise per Week:   . Minutes of Exercise per Session:   Stress:   . Feeling of Stress :   Social Connections:   . Frequency of Communication with Friends and Family:   . Frequency of Social Gatherings with Friends and Family:   . Attends Religious Services:   . Active Member of Clubs or Organizations:   . Attends Archivist Meetings:   Marland Kitchen  Marital Status:      Outpatient Medications Prior to Visit  Medication Sig Dispense Refill  . albuterol (VENTOLIN HFA) 108 (90 Base) MCG/ACT inhaler Inhale into the lungs. (Patient not taking: Reported on 08/08/2020)     No facility-administered medications prior to visit.    Allergies  Allergen Reactions  . Penicillins     ROS As per HPI  PE: Vitals with BMI 08/08/2020 03/02/2019 12/30/2018  Height '5\' 11"'  '5\' 11"'  '5\' 11"'   Weight 234 lbs 10 oz 243 lbs 238 lbs 4 oz  BMI 32.73 42.35 36.14  Systolic 431 540 96  Diastolic 81 64 61  Pulse 94 99 74  O2 sat on RA today is 96%  Gen: Alert, well appearing.  Patient is oriented to person, place, time, and situation. AFFECT: pleasant, lucid thought and speech. ENT: Ears: EACs clear, normal epithelium.  TMs with good light reflex and landmarks bilaterally.  Eyes: no injection, icteris, swelling, or exudate.  EOMI, PERRLA. Nose: no drainage or turbinate edema/swelling.  No injection or focal lesion.  Mouth: lips without lesion/swelling.  Oral mucosa pink and moist.  Dentition intact and without obvious caries or gingival swelling.  Oropharynx without erythema, exudate, or swelling.  Neck: supple/nontender.  No LAD, mass, or TM.  Carotid pulses 2+ bilaterally, without bruits. CV: RRR, no m/r/g.   LUNGS: CTA bilat, nonlabored resps, good aeration in all lung fields. ABD: soft, NT, ND, BS normal.  No hepatospenomegaly or mass.  No bruits. EXT: no clubbing, cyanosis, or edema.  Musculoskeletal: no joint swelling, erythema, warmth, or tenderness.  ROM of all joints intact. Skin - no sores or suspicious lesions or rashes or color changes Rectal: pt declined   LABS:  Lab Results  Component Value Date   TSH 1.65 12/22/2018   Lab Results  Component Value Date   WBC 8.7 12/22/2018   HGB 15.8 12/22/2018   HCT 45.5 12/22/2018   MCV 84.9 12/22/2018   PLT 266.0 12/22/2018   Lab Results  Component Value Date   CREATININE 1.03 12/22/2018   BUN  15 12/22/2018   NA 139 12/22/2018   K 4.3 12/22/2018   CL 107 12/22/2018   CO2 25 12/22/2018   Lab Results  Component Value Date   ALT 29 12/22/2018   AST 18 12/22/2018   ALKPHOS 54 12/22/2018   BILITOT 0.6 12/22/2018   Lab Results  Component Value Date   CHOL 151 04/24/2016   Lab Results  Component Value Date   HDL  32.10 (L) 04/24/2016   No results found for: [facility] Lab Results  Component Value Date   TRIG 271.0 (H) 04/24/2016   Lab Results  Component Value Date   CHOLHDL 5 04/24/2016   Lab Results  Component Value Date   PSA 0.92 04/24/2016   Lab Results  Component Value Date   LABURIC 6.7 04/24/2016   IMPRESSION AND PLAN:  Fatigue, subacute.  History of similar in the past. No red flags for cardiopulmonary cause. Has OSA that he has not been able to tolerate CPAP for.  This could be a problem causing current sx's. We'll do some blood work: CBC, CMET, ESR, TSH, testosterone, hba1c.  Prostate ca screening: he declined DRE.  PSA drawn today.  An After Visit Summary was printed and given to the patient.  FOLLOW UP: Return for f/u to be determined based on results of blood testing.  Signed:  Crissie Sickles, MD           08/08/2020

## 2020-08-09 LAB — COMPREHENSIVE METABOLIC PANEL
ALT: 21 U/L (ref 0–53)
AST: 17 U/L (ref 0–37)
Albumin: 4.3 g/dL (ref 3.5–5.2)
Alkaline Phosphatase: 60 U/L (ref 39–117)
BUN: 15 mg/dL (ref 6–23)
CO2: 26 mEq/L (ref 19–32)
Calcium: 9.3 mg/dL (ref 8.4–10.5)
Chloride: 106 mEq/L (ref 96–112)
Creatinine, Ser: 1.27 mg/dL (ref 0.40–1.50)
GFR: 59.83 mL/min — ABNORMAL LOW (ref >60.00)
Glucose, Bld: 96 mg/dL (ref 70–99)
Potassium: 3.8 mEq/L (ref 3.5–5.1)
Sodium: 141 mEq/L (ref 135–145)
Total Bilirubin: 0.5 mg/dL (ref 0.2–1.2)
Total Protein: 7.2 g/dL (ref 6.0–8.3)

## 2020-08-09 LAB — LIPID PANEL
Cholesterol: 153 mg/dL (ref 0–200)
HDL: 29.3 mg/dL — ABNORMAL LOW (ref >39.00)
Total CHOL/HDL Ratio: 5
Triglycerides: 512 mg/dL — ABNORMAL HIGH (ref 0.0–149.0)

## 2020-08-09 LAB — CBC WITH DIFFERENTIAL/PLATELET
Basophils Absolute: 0.1 10*3/uL (ref 0.0–0.1)
Basophils Relative: 1.3 % (ref 0.0–3.0)
Eosinophils Absolute: 0.1 10*3/uL (ref 0.0–0.7)
Eosinophils Relative: 1.3 % (ref 0.0–5.0)
HCT: 43.5 % (ref 39.0–52.0)
Hemoglobin: 15.3 g/dL (ref 13.0–17.0)
Lymphocytes Relative: 26.2 % (ref 12.0–46.0)
Lymphs Abs: 2.9 10*3/uL (ref 0.7–4.0)
MCHC: 35.1 g/dL (ref 30.0–36.0)
MCV: 84.6 fl (ref 78.0–100.0)
Monocytes Absolute: 0.6 10*3/uL (ref 0.1–1.0)
Monocytes Relative: 5.5 % (ref 3.0–12.0)
Neutro Abs: 7.1 10*3/uL (ref 1.4–7.7)
Neutrophils Relative %: 65.7 % (ref 43.0–77.0)
Platelets: 269 10*3/uL (ref 150.0–400.0)
RBC: 5.14 Mil/uL (ref 4.22–5.81)
RDW: 13.4 % (ref 11.5–15.5)
WBC: 10.9 10*3/uL — ABNORMAL HIGH (ref 4.0–10.5)

## 2020-08-09 LAB — HEMOGLOBIN A1C: Hgb A1c MFr Bld: 5.3 % (ref 4.6–6.5)

## 2020-08-09 LAB — TSH: TSH: 2.08 u[IU]/mL (ref 0.35–4.50)

## 2020-08-09 LAB — LDL CHOLESTEROL, DIRECT: Direct LDL: 67 mg/dL

## 2020-08-09 LAB — SEDIMENTATION RATE: Sed Rate: 10 mm/hr (ref 0–20)

## 2020-08-09 LAB — TESTOSTERONE: Testosterone: 232.12 ng/dL — ABNORMAL LOW (ref 300.00–890.00)

## 2020-08-09 LAB — PSA: PSA: 0.73 ng/mL (ref 0.10–4.00)

## 2020-08-10 ENCOUNTER — Other Ambulatory Visit: Payer: Self-pay | Admitting: Family Medicine

## 2020-08-10 ENCOUNTER — Telehealth: Payer: Self-pay

## 2020-08-10 ENCOUNTER — Other Ambulatory Visit: Payer: Self-pay

## 2020-08-10 DIAGNOSIS — E291 Testicular hypofunction: Secondary | ICD-10-CM

## 2020-08-10 DIAGNOSIS — E781 Pure hyperglyceridemia: Secondary | ICD-10-CM

## 2020-08-10 NOTE — Telephone Encounter (Signed)
Pt said he was not fasting when recent lab work was done. He wants to know if he should fast and redo the blood work before starting the new medication.

## 2020-08-10 NOTE — Telephone Encounter (Signed)
Yes, ok to order fasting lipid panel and he can get this when he returns for his testosterone repeat labs. Dx is hypertriglyceridemia.-thx

## 2020-08-15 ENCOUNTER — Ambulatory Visit (INDEPENDENT_AMBULATORY_CARE_PROVIDER_SITE_OTHER): Payer: 59 | Admitting: Family Medicine

## 2020-08-15 ENCOUNTER — Other Ambulatory Visit: Payer: Self-pay

## 2020-08-15 DIAGNOSIS — E781 Pure hyperglyceridemia: Secondary | ICD-10-CM | POA: Diagnosis not present

## 2020-08-15 DIAGNOSIS — E291 Testicular hypofunction: Secondary | ICD-10-CM

## 2020-08-15 LAB — CORTISOL: Cortisol, Plasma: 5.9 ug/dL

## 2020-08-15 LAB — LIPID PANEL
Cholesterol: 148 mg/dL (ref 0–200)
HDL: 30.5 mg/dL — ABNORMAL LOW (ref >39.00)
NonHDL: 117.5
Total CHOL/HDL Ratio: 5
Triglycerides: 214 mg/dL — ABNORMAL HIGH (ref 0.0–149.0)
VLDL: 42.8 mg/dL — ABNORMAL HIGH (ref 0.0–40.0)

## 2020-08-15 LAB — LDL CHOLESTEROL, DIRECT: Direct LDL: 92 mg/dL

## 2020-08-15 LAB — TESTOSTERONE: Testosterone: 377.39 ng/dL (ref 300.00–890.00)

## 2020-08-15 LAB — LUTEINIZING HORMONE: LH: 2.22 m[IU]/mL (ref 1.50–9.30)

## 2020-08-16 LAB — PROLACTIN: Prolactin: 4.1 ng/mL (ref 2.0–18.0)

## 2020-12-21 HISTORY — PX: EXPLORATORY LAPAROTOMY: SUR591

## 2021-04-03 ENCOUNTER — Other Ambulatory Visit: Payer: Self-pay

## 2021-04-03 ENCOUNTER — Ambulatory Visit (INDEPENDENT_AMBULATORY_CARE_PROVIDER_SITE_OTHER): Payer: 59

## 2021-04-03 ENCOUNTER — Ambulatory Visit (INDEPENDENT_AMBULATORY_CARE_PROVIDER_SITE_OTHER): Payer: 59 | Admitting: Podiatry

## 2021-04-03 DIAGNOSIS — D1622 Benign neoplasm of long bones of left lower limb: Secondary | ICD-10-CM | POA: Diagnosis not present

## 2021-04-03 DIAGNOSIS — M1A4790 Other secondary chronic gout, unspecified ankle and foot, without tophus (tophi): Secondary | ICD-10-CM | POA: Diagnosis not present

## 2021-04-03 DIAGNOSIS — M778 Other enthesopathies, not elsewhere classified: Secondary | ICD-10-CM

## 2021-04-06 ENCOUNTER — Encounter: Payer: Self-pay | Admitting: Podiatry

## 2021-04-06 NOTE — Progress Notes (Signed)
  Subjective:  Patient ID: Michael Hickman, male    DOB: 1971-01-20,  MRN: [id number]  Chief Complaint  Patient presents with  . Foot Pain     (xrays)(np) after last gout flare up, feet have been in pain since     50 y.o. male presents with the above complaint. History confirmed with patient.  This started about 6 weeks ago.  He had gout in his left ankle in his great toe and a week later in the right big toe.  His feet feel somewhat bruised in the bottom by the end of the day.  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 Left Foot: normal exam, no swelling, tenderness, instability; ligaments intact, full range of motion of all ankle/foot joints  Right Foot: normal exam, no swelling, tenderness, instability; ligaments intact, full range of motion of all ankle/foot joints    Radiographs: X-ray of both feet: Benign-appearing cystic bone tumor on the anteromedial tibia, no major degenerative changes in the first metatarsophalangeal joints or ankles Assessment:   1. Capsulitis of foot, unspecified laterality   2. 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  Discussed etiology to options for acute and chronic gout.  His seems to be improving for him so far.  Did not have significant pain on exam today.  We will continue to monitor this.  I advised him if his gout flares and is in the joint we could do an aspiration and injection of corticosteroid to alleviate any pain and pressure.  His bony tumor is visible on his x-rays and has been slow-growing and is not painful.  This has been present for many years.  Continue to monitor and if becomes bothersome or symptomatic will order MRI  to evaluate  Return if symptoms worsen or fail to improve.

## 2021-09-21 ENCOUNTER — Telehealth: Payer: Self-pay

## 2021-09-21 NOTE — Telephone Encounter (Signed)
Pt walked into office for hand numbness. Pt reports tingling 2nd 3rd and 4 digits on right hand starting yesterday. Denies any other symptoms. After speaking with providers, pt was advised to go to urgent care or sched appt with PCP in next avail slot.

## 2021-10-06 ENCOUNTER — Encounter: Payer: Self-pay | Admitting: Internal Medicine

## 2021-10-19 ENCOUNTER — Encounter: Payer: Self-pay | Admitting: Family Medicine

## 2021-10-19 ENCOUNTER — Ambulatory Visit (INDEPENDENT_AMBULATORY_CARE_PROVIDER_SITE_OTHER): Payer: 59 | Admitting: Family Medicine

## 2021-10-19 ENCOUNTER — Other Ambulatory Visit: Payer: Self-pay

## 2021-10-19 VITALS — BP 130/74 | HR 77 | Temp 98.0°F | Resp 16 | Wt 235.0 lb

## 2021-10-19 DIAGNOSIS — M546 Pain in thoracic spine: Secondary | ICD-10-CM

## 2021-10-19 NOTE — Progress Notes (Signed)
OFFICE VISIT  10/19/2021  CC:  Chief Complaint  Patient presents with   Shoulder Pain    Right, almost gone away. Not currently taking anything for pain   HPI:    Patient is a 50 y.o. male who presents for right shoulder pain. Focal pain in R scapula area almost every night for about 2 mo, only happened when he lay down to sleep at night.  Got so bad he had to get up and sit sleeping upright in recliner. No shoulder pain.  No new repetitive motion/strain prior. Wife said she could see/feel a little subQ bump in the area. Now, in the last week or so pt says it has nearly completely resolved. NO meds tried.  Tried heat some, no help.   Past Medical History:  Diagnosis Date   Amaurosis fugax of right eye 2020   Transient monocular vision loss--brief.  W/u unremarkable.  Neurologist rec'd 81mg  ASA qd and good control of OSA.   Chronic fatigue    Gout    Hay fever    Hypertriglyceridemia    Lumbar disc disease 02/10/2018   OSA (obstructive sleep apnea)    CPAP in the past but he could not tolerate this; eventually pt gave up on the process.  After 2nd sleep study, he could not afford the CPAP.     Osteoarthritis, multiple sites    knees, back, neck    Past Surgical History:  Procedure Laterality Date   APPENDECTOMY  05/24/1999   COLONOSCOPY  ? approx 2017   part of w/u for abd pain per pt's best recollection (Dr. Mann?)-->? polyps?  pt cannot recall any details   EXPLORATORY LAPAROTOMY  12/21/2020   lysis of adhesions   HOLTER MONITOR 48H  12/2018   NORMAL   TRANSTHORACIC ECHOCARDIOGRAM  09/2018   EF 55-60%, NO ABNORMALITIES    Outpatient Medications Prior to Visit  Medication Sig Dispense Refill   albuterol (VENTOLIN HFA) 108 (90 Base) MCG/ACT inhaler Inhale into the lungs. (Patient not taking: Reported on 10/19/2021)     No facility-administered medications prior to visit.    Allergies  Allergen Reactions   Penicillins     ROS As per HPI  PE: Vitals with  BMI 10/19/2021 08/08/2020 03/02/2019  Height - 5\' 11"  5\' 11"   Weight 235 lbs 234 lbs 10 oz 243 lbs  BMI - 85.88 50.27  Systolic 741 287 867  Diastolic 74 81 64  Pulse 77 94 99     Gen: Alert, well appearing.  Patient is oriented to person, place, time, and situation. AFFECT: pleasant, lucid thought and speech. R infrascap area with subtle focal soft tissue nodule/knot in the muscle----the knot is not palpable unless he tenses his back muscles.  Minimal focal discomfort to palpation in the area.  No overlying skin changes.  LABS:    Chemistry      Component Value Date/Time   NA 141 08/08/2020 1548   K 3.8 08/08/2020 1548   CL 106 08/08/2020 1548   CO2 26 08/08/2020 1548   BUN 15 08/08/2020 1548   CREATININE 1.27 08/08/2020 1548      Component Value Date/Time   CALCIUM 9.3 08/08/2020 1548   ALKPHOS 60 08/08/2020 1548   AST 17 08/08/2020 1548   ALT 21 08/08/2020 1548   BILITOT 0.5 08/08/2020 1548     Lab Results  Component Value Date   HGBA1C 5.3 08/08/2020   IMPRESSION AND PLAN:  R mid back/infrascapular trigger point. Essentially resolved at  this time. Reassured. Signs/symptoms to call or return for were reviewed and pt expressed understanding.  An After Visit Summary was printed and given to the patient.  FOLLOW UP: Return if symptoms worsen or fail to improve.  Signed:  Crissie Sickles, MD           10/19/2021

## 2021-11-07 ENCOUNTER — Ambulatory Visit (INDEPENDENT_AMBULATORY_CARE_PROVIDER_SITE_OTHER): Payer: 59 | Admitting: Internal Medicine

## 2021-11-07 ENCOUNTER — Encounter: Payer: Self-pay | Admitting: Internal Medicine

## 2021-11-07 VITALS — BP 104/70 | HR 82 | Ht 71.0 in | Wt 233.0 lb

## 2021-11-07 DIAGNOSIS — Z8719 Personal history of other diseases of the digestive system: Secondary | ICD-10-CM | POA: Diagnosis not present

## 2021-11-07 DIAGNOSIS — R197 Diarrhea, unspecified: Secondary | ICD-10-CM

## 2021-11-07 DIAGNOSIS — K219 Gastro-esophageal reflux disease without esophagitis: Secondary | ICD-10-CM

## 2021-11-07 DIAGNOSIS — Z1211 Encounter for screening for malignant neoplasm of colon: Secondary | ICD-10-CM | POA: Diagnosis not present

## 2021-11-07 MED ORDER — NA SULFATE-K SULFATE-MG SULF 17.5-3.13-1.6 GM/177ML PO SOLN: 1.0000 | Freq: Once | ORAL | 0 refills | Status: AC

## 2021-11-07 NOTE — Patient Instructions (Signed)
If you are age 52 or older, your body mass index should be between 23-30. Your Body mass index is 32.5 kg/m. If this is out of the aforementioned range listed, please consider follow up with your Primary Care Provider.  If you are age 34 or younger, your body mass index should be between 19-25. Your Body mass index is 32.5 kg/m. If this is out of the aformentioned range listed, please consider follow up with your Primary Care Provider.   ________________________________________________________  The [facility] providers would like to encourage you to use [facility] to communicate with providers for non-urgent requests or questions.  Due to long hold times on the telephone, sending your provider a message by [facility] may be a faster and more efficient way to get a response.  Please allow 48 business hours for a response.  Please remember that this is for non-urgent requests.  _______________________________________________________  Michael Hickman have been scheduled for a colonoscopy. Please follow written instructions given to you at your visit today.  Please pick up your prep supplies at the pharmacy within the next 1-3 days. If you use inhalers (even only as needed), please bring them with you on the day of your procedure.

## 2021-11-07 NOTE — Progress Notes (Signed)
HISTORY OF PRESENT ILLNESS:  Michael Hickman is a 50 y.o. male, maintenance personnel for [employer] and husband of Goldenrod, with past medical history as listed below who presents today regarding recent problems with nausea and diarrhea and the need for colonoscopy.  The patient reports having developed a diarrheal illness in December 2021.  He subsequently developed problems with abdominal pain for which she was hospitalized and was found to have a small bowel obstruction which required laparoscopy with lysis of adhesions (I have reviewed that encounter).  After recovering, he has done well since.  Approximate 1 month ago he developed problems with nausea and diarrhea.  He and his wife are concerned that this may be an indication of evolving small bowel obstruction.  This appointment was made.  Fortunately, his problems with diarrhea and nausea have resolved.  He rarely notices minor amounts of rectal bleeding on the tissue only.  He reports a remote colonoscopy elsewhere in [place].  Maybe 10 years ago.  Apparently, no abnormalities.  He is interested in follow-up screening colonoscopy.  His GI review of systems is otherwise remarkable for occasional problems with indigestion and heartburn.  No alarm features such as dysphagia.  Review of blood work from March 2022 shows unremarkable comprehensive metabolic panel.  CBC with hemoglobin 14.0.  REVIEW OF SYSTEMS:  All non-GI ROS negative unless otherwise stated in the HPI except for arthritis  Past Medical History:  Diagnosis Date   Amaurosis fugax of right eye 2020   Transient monocular vision loss--brief.  W/u unremarkable.  Neurologist rec'd 81mg  ASA qd and good control of OSA.   Chronic fatigue    Gout    Hay fever    Hypertriglyceridemia    Lumbar disc disease 02/10/2018   OSA (obstructive sleep apnea)    CPAP in the past but he could not tolerate this; eventually pt gave up on the process.  After 2nd sleep study, he could not afford the  CPAP.     Osteoarthritis, multiple sites    knees, back, neck    Past Surgical History:  Procedure Laterality Date   APPENDECTOMY  05/24/1999   COLONOSCOPY  ? approx 2017   part of w/u for abd pain per pt's best recollection (Dr. Mann?)-->? polyps?  pt cannot recall any details   EXPLORATORY LAPAROTOMY  12/21/2020   lysis of adhesions   HOLTER MONITOR 48H  12/2018   NORMAL   TRANSTHORACIC ECHOCARDIOGRAM  09/2018   EF 55-60%, NO ABNORMALITIES    Social History FUAD FORGET  reports that he has never smoked. He has never used smokeless tobacco. He reports that he does not drink alcohol and does not use drugs.  family history includes Healthy in his father and mother; Lung cancer in his paternal grandfather; Multiple sclerosis in his maternal grandfather; Pancreatic cancer in his maternal grandmother.  Allergies  Allergen Reactions   Penicillins        PHYSICAL EXAMINATION: Vital signs: BP 104/70   Pulse 82   Ht 5\' 11"  (1.803 m)   Wt 233 lb (105.7 kg)   BMI 32.50 kg/m   Constitutional: generally well-appearing, no acute distress Psychiatric: alert and oriented x3, cooperative Eyes: extraocular movements intact, anicteric, conjunctiva pink Mouth: oral pharynx moist, no lesions Neck: supple no lymphadenopathy Cardiovascular: heart regular rate and rhythm, no murmur Lungs: clear to auscultation bilaterally Abdomen: soft, nontender, nondistended, no obvious ascites, no peritoneal signs, normal bowel sounds, no organomegaly Rectal: Deferred until colonoscopy Extremities: no clubbing, cyanosis,  or lower extremity edema bilaterally Skin: no lesions on visible extremities Neuro: No focal deficits.  Cranial nerves intact  ASSESSMENT:  1.  Recent problems with nausea and diarrhea.  Most consistent with self-limited infectious gastroenteritis. 2.  History of small bowel obstruction.  Required laparoscopy with lysis of adhesions.  No recurrent issues. 3.  Colon cancer  screening.  Average risk 4.  GERD without alarm features   PLAN:  1.  Reflux precautions 2.  OTC acid suppressive therapy as needed 3.  Schedule screening colonoscopy.The nature of the procedure, as well as the risks, benefits, and alternatives were carefully and thoroughly reviewed with the patient. Ample time for discussion and questions allowed. The patient understood, was satisfied, and agreed to proceed.

## 2021-11-17 ENCOUNTER — Encounter: Payer: 59 | Admitting: Internal Medicine

## 2022-08-15 ENCOUNTER — Encounter: Payer: Self-pay | Admitting: Family Medicine

## 2022-08-15 ENCOUNTER — Ambulatory Visit (INDEPENDENT_AMBULATORY_CARE_PROVIDER_SITE_OTHER): Payer: 59 | Admitting: Family Medicine

## 2022-08-15 VITALS — BP 110/75 | HR 93 | Temp 97.4°F | Ht 71.0 in | Wt 225.8 lb

## 2022-08-15 DIAGNOSIS — J189 Pneumonia, unspecified organism: Secondary | ICD-10-CM

## 2022-08-15 MED ORDER — AZITHROMYCIN 250 MG PO TABS: ORAL_TABLET | ORAL | 0 refills | Status: AC

## 2022-08-15 MED ORDER — PREDNISONE 20 MG PO TABS: ORAL_TABLET | ORAL | 0 refills | Status: DC

## 2022-08-15 NOTE — Patient Instructions (Addendum)
Xyzal one tab before bed to help with hives. Take for at least 1 week.  Start prednisone taper Start z-pack Follow up pcp in 10 days if sx not resolved.

## 2022-08-15 NOTE — Progress Notes (Signed)
Michael Hickman , 1971-05-07, 51 y.o., male MRN: [id number] Patient Care Team    Relationship Specialty Notifications Start End  Tammi Sou, MD PCP - General Family Medicine  12/19/18   Pieter Partridge, DO Consulting Physician Neurology  03/02/19   Nevada Crane, MD Consulting Physician Rheumatology  10/19/21     Chief Complaint  Patient presents with   Follow-up    Pneumonia; pt states mild improvements. Current sxs: broke out in a rash (legs, stomach, arms itching), productive cough (mostly white phlegm but sometimes yellow)     Subjective: Pt presents for an OV to follow-up on recent diagnosis of pneumonia.  He had been seen at 2 different locations of urgent care over the last few weeks.  He states symptoms started about 3 weeks ago and he had a headache and had temperature of 103 F.  He was seen at an outside urgent care and started on Levaquin for pneumonia.  He then broke out in hives on his lower extremities and was seen at another urgent care.  He was concerned the Levaquin caused the hives.  He received a shot of steroids and prescribed a DuoNeb 3-4 times a day.  Since that time patient has finished his Levaquin.  It was not felt the rash was consistent with a drug rash.  He states he thought he was feeling better until the day before yesterday.  He noticed cough again started to worsen and he would have coughing fits to the point where he became dizzy and almost blacked out. Viewed chest x-ray result in care everywhere dated 08/11/2022 cardiac silhouette is within normal limits.  Streaky left basilar airspace opacifications.  No discernible normal thorax.  No pleural effusion.     10/19/2021    3:38 PM 02/10/2018    4:07 PM  Depression screen PHQ 2/9  Decreased Interest 0 0  Down, Depressed, Hopeless 0 0  PHQ - 2 Score 0 0    Allergies  Allergen Reactions   Penicillins    Social History   Social History Narrative   Married to [place], 1 daughter.   Orig  from [place].   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   No tob.   No alc.        Patient is right-handed. He lives with his wife in a two level home. He drinks one cup of coffee, and one large glass of tea a day, he does not exercise.   Past Medical History:  Diagnosis Date   Amaurosis fugax of right eye 2020   Transient monocular vision loss--brief.  W/u unremarkable.  Neurologist rec'd '81mg'$  ASA qd and good control of OSA.   Chronic fatigue    Gout    Hay fever    Hypertriglyceridemia    Lumbar disc disease 02/10/2018   OSA (obstructive sleep apnea)    CPAP in the past but he could not tolerate this; eventually pt gave up on the process.  After 2nd sleep study, he could not afford the CPAP.     Osteoarthritis, multiple sites    knees, back, neck   Past Surgical History:  Procedure Laterality Date   APPENDECTOMY  05/24/1999   COLONOSCOPY  ? approx 2017   part of w/u for abd pain per pt's best recollection (Dr. Mann?)-->? polyps?  pt cannot recall any details   EXPLORATORY LAPAROTOMY  12/21/2020   lysis of adhesions   HOLTER MONITOR 48H  12/2018   NORMAL  TRANSTHORACIC ECHOCARDIOGRAM  09/2018   EF 55-60%, NO ABNORMALITIES   Family History  Problem Relation Age of Onset   Pancreatic cancer Maternal Grandmother    Multiple sclerosis Maternal Grandfather    Healthy Mother    Healthy Father    Lung cancer Paternal Grandfather    Allergies as of 08/15/2022       Reactions   Penicillins         Medication List        Accurate as of August 15, 2022 11:59 PM. If you have any questions, ask your nurse or doctor.          azithromycin 250 MG tablet Commonly known as: ZITHROMAX Take 2 tablets on day 1, then 1 tablet daily on days 2 through 5 Started by: Howard Pouch, DO   ipratropium-albuterol 0.5-2.5 (3) MG/3ML Soln Commonly known as: DUONEB Take by nebulization.   predniSONE 20 MG tablet Commonly known as: DELTASONE 60 mg x3d, 40 mg x3d, 20 mg x2d, 10  mg x2d Started by: Howard Pouch, DO        All past medical history, surgical history, allergies, family history, immunizations andmedications were updated in the EMR today and reviewed under the history and medication portions of their EMR.     ROS Negative, with the exception of above mentioned in HPI   Objective:  BP 110/75   Pulse 93   Temp (!) 97.4 F (36.3 C) (Oral)   Ht '5\' 11"'$  (1.803 m)   Wt 225 lb 12.8 oz (102.4 kg)   SpO2 94%   BMI 31.49 kg/m  Body mass index is 31.49 kg/m. Physical Exam Vitals and nursing note reviewed.  Constitutional:      General: He is not in acute distress.    Appearance: Normal appearance. He is not ill-appearing, toxic-appearing or diaphoretic.  HENT:     Head: Normocephalic and atraumatic.     Right Ear: Tympanic membrane and ear canal normal. There is no impacted cerumen.     Left Ear: Tympanic membrane and ear canal normal. There is no impacted cerumen.     Nose: Congestion present. No rhinorrhea.     Mouth/Throat:     Mouth: Mucous membranes are moist.     Pharynx: No oropharyngeal exudate or posterior oropharyngeal erythema.  Eyes:     General: No scleral icterus.       Right eye: No discharge.        Left eye: No discharge.     Extraocular Movements: Extraocular movements intact.     Pupils: Pupils are equal, round, and reactive to light.  Cardiovascular:     Rate and Rhythm: Normal rate and regular rhythm.  Pulmonary:     Effort: Pulmonary effort is normal. No respiratory distress.     Breath sounds: Wheezing, rhonchi and rales present.     Comments: Mild decreased air movement. Musculoskeletal:     Cervical back: Neck supple.     Right lower leg: No edema.     Left lower leg: No edema.  Lymphadenopathy:     Cervical: No cervical adenopathy.  Skin:    General: Skin is warm and dry.     Coloration: Skin is not jaundiced or pale.     Findings: Rash (Hives present lower extremity) present.  Neurological:     Mental  Status: He is alert and oriented to person, place, and time. Mental status is at baseline.  Psychiatric:        Mood and  Affect: Mood normal.        Behavior: Behavior normal.        Thought Content: Thought content normal.        Judgment: Judgment normal.      No results found. No results found. No results found for this or any previous visit (from the past 24 hour(s)).  Assessment/Plan: ARZELL MCGEEHAN is a 51 y.o. male present for OV for  Pneumonia due to infectious organism, unspecified laterality, unspecified part of lung Rest, hydrate.  mucinex (DM if cough) - predniSONE (DELTASONE) 20 MG tablet; 60 mg x3d, 40 mg x3d, 20 mg x2d, 10 mg x2d  Dispense: 18 tablet; Refill: 0 - azithromycin (ZITHROMAX) 250 MG tablet; Take 2 tablets on day 1, then 1 tablet daily on days 2 through 5  Dispense: 6 tablet; Refill: 0 F/U 2 weeks if not improved.   Reviewed expectations re: course of current medical issues. Discussed self-management of symptoms. Outlined signs and symptoms indicating need for more acute intervention. Patient verbalized understanding and all questions were answered. Patient received an After-Visit Summary.    No orders of the defined types were placed in this encounter.  Meds ordered this encounter  Medications   predniSONE (DELTASONE) 20 MG tablet    Sig: 60 mg x3d, 40 mg x3d, 20 mg x2d, 10 mg x2d    Dispense:  18 tablet    Refill:  0   azithromycin (ZITHROMAX) 250 MG tablet    Sig: Take 2 tablets on day 1, then 1 tablet daily on days 2 through 5    Dispense:  6 tablet    Refill:  0   Referral Orders  No referral(s) requested today     Note is dictated utilizing voice recognition software. Although note has been proof read prior to signing, occasional typographical errors still can be missed. If any questions arise, please do not hesitate to call for verification.   electronically signed by:  Howard Pouch, DO  [place]

## 2022-08-29 ENCOUNTER — Telehealth: Payer: Self-pay

## 2022-08-29 ENCOUNTER — Encounter: Payer: Self-pay | Admitting: Family Medicine

## 2022-08-29 ENCOUNTER — Ambulatory Visit (INDEPENDENT_AMBULATORY_CARE_PROVIDER_SITE_OTHER): Payer: 59 | Admitting: Family Medicine

## 2022-08-29 VITALS — BP 116/70 | HR 91 | Temp 98.4°F | Ht 71.0 in | Wt 233.2 lb

## 2022-08-29 DIAGNOSIS — R21 Rash and other nonspecific skin eruption: Secondary | ICD-10-CM | POA: Diagnosis not present

## 2022-08-29 NOTE — Telephone Encounter (Signed)
Spoke with pt, offered appt. Pt agreed. Scheduled for 420 today

## 2022-08-29 NOTE — Telephone Encounter (Signed)
Patient was last seen by Dr. Raoul Pitch on 8/16 for pneumonia.  Patient was previously prescribed antibiotic (outside of [facility] network).  Patient had med reaction; broke out in hives in lower extremity. Patient states he still has hives.  He has completed all medication prescribed by Dr. Raoul Pitch on 8/16.  Patient's concern is should he still have hive outbreak? Sending encounter to PCP.  Please advise. [phone]

## 2022-08-29 NOTE — Progress Notes (Signed)
OFFICE VISIT  08/29/2022  CC:  Chief Complaint  Patient presents with   Follow-up    Pneumonia and rash; pt completed prednisone and still not having improvement. Hives are mainly on legs, started to appear on arms today.    Patient is a 51 y.o. male who presents for follow-up pneumonia and rash.  HPI: A few weeks ago Michael Hickman started getting a sore throat and headache and fever.  Over the next couple days it progressed to a cough and wheezing. Initial visit with an urgent care showed x-ray concerning for pneumonia and he was started on Levaquin. A couple days later he started to get a red bumpy and itchy rash, primarily on the left leg. He then returned to the urgent care.  He got a steroid injection and it sounds like the rash resolved for a couple days.  Rash then returned and gradually spread to involve both legs and arms.   It sounds like he was able to finish the course of Levaquin. He then was seen here by Dr. Raoul Pitch on 08/15/2022 and was prescribed a Z-Pak and a 10-day taper of prednisone. His respiratory illness has nearly cleared up, just some mild residual cough. However, his rash has persisted. He has not taken any antihistamines.  No new recent contact irritant exposure has been suspected by the patient or family.  Multiple home COVID tests throughout this illness were negative.  He has a couple of grandchildren that have been in daycare and had frequent viral illnesses for the last several months.  Past Medical History:  Diagnosis Date   Amaurosis fugax of right eye 2020   Transient monocular vision loss--brief.  W/u unremarkable.  Neurologist rec'd '81mg'$  ASA qd and good control of OSA.   Chronic fatigue    Gout    Hay fever    Hypertriglyceridemia    Lumbar disc disease 02/10/2018   OSA (obstructive sleep apnea)    CPAP in the past but he could not tolerate this; eventually pt gave up on the process.  After 2nd sleep study, he could not afford the CPAP.      Osteoarthritis, multiple sites    knees, back, neck    Past Surgical History:  Procedure Laterality Date   APPENDECTOMY  05/24/1999   COLONOSCOPY  ? approx 2017   part of w/u for abd pain per pt's best recollection (Dr. Mann?)-->? polyps?  pt cannot recall any details   EXPLORATORY LAPAROTOMY  12/21/2020   lysis of adhesions   HOLTER MONITOR 48H  12/2018   NORMAL   TRANSTHORACIC ECHOCARDIOGRAM  09/2018   EF 55-60%, NO ABNORMALITIES    Outpatient Medications Prior to Visit  Medication Sig Dispense Refill   ipratropium-albuterol (DUONEB) 0.5-2.5 (3) MG/3ML SOLN Take by nebulization.     predniSONE (DELTASONE) 20 MG tablet 60 mg x3d, 40 mg x3d, 20 mg x2d, 10 mg x2d (Patient not taking: Reported on 08/29/2022) 18 tablet 0   No facility-administered medications prior to visit.    Allergies  Allergen Reactions   Penicillins     ROS As per HPI  PE:    08/29/2022    4:11 PM 08/15/2022   10:18 AM 11/07/2021    2:58 PM  Vitals with BMI  Height '5\' 11"'$  '5\' 11"'$  '5\' 11"'$   Weight 233 lbs 3 oz 225 lbs 13 oz 233 lbs  BMI 32.54 09.32 35.57  Systolic 322 025 427  Diastolic 70 75 70  Pulse 91 93 82  Physical Exam  Gen: Alert, well appearing.  Patient is oriented to person, place, time, and situation.. AFFECT: pleasant, lucid thought and speech. ZCH:YIFO: no injection, icteris, swelling, or exudate.  EOMI, PERRLA. Mouth: lips without lesion/swelling.  Oral mucosa pink and moist. Oropharynx without erythema, exudate, or swelling.  Neck: no adenopathy CV: RRR, no m/r/g.   LUNGS: CTA bilat, nonlabored resps, good aeration in all lung fields. Skin: Left thigh with many pinkish papules, most of them slightly excoriated.  Similar lesions are on both arms and legs but very sparsely scattered. No vesicles, no hives, no pustules, no petechiae.  LABS:  Last CBC Lab Results  Component Value Date   WBC 10.9 (H) 08/08/2020   HGB 15.3 08/08/2020   HCT 43.5 08/08/2020   MCV 84.6  08/08/2020   RDW 13.4 08/08/2020   PLT 269.0 27/74/1287   Last metabolic panel Lab Results  Component Value Date   GLUCOSE 96 08/08/2020   NA 141 08/08/2020   K 3.8 08/08/2020   CL 106 08/08/2020   CO2 26 08/08/2020   BUN 15 08/08/2020   CREATININE 1.27 08/08/2020   CALCIUM 9.3 08/08/2020   PROT 7.2 08/08/2020   ALBUMIN 4.3 08/08/2020   BILITOT 0.5 08/08/2020   ALKPHOS 60 08/08/2020   AST 17 08/08/2020   ALT 21 08/08/2020   IMPRESSION AND PLAN:  Rash. I believe this is a viral exanthem. It does not appear consistent with a drug rash. Reassurance given.  Encouraged him to take nonsedating over-the-counter antihistamine as needed for itching. Signs/symptoms to call or return for were reviewed and pt expressed understanding.  His recent URI with cough illness has essentially resolved.  An After Visit Summary was printed and given to the patient.  FOLLOW UP: No follow-ups on file.  Signed:  Crissie Sickles, MD           08/29/2022

## 2022-09-04 NOTE — Progress Notes (Unsigned)
Synopsis: Referred for pneumonia by Michael Sou, MD  Subjective:   PATIENT ID: Michael Hickman GENDER: male DOB: [birth year], MRN: [id number]  No chief complaint on file.  51yM with history of OSA not on CPAP referred for pneumonia  A month ago had sore throat, HA, fever, cough, wheeze, had CXR concerning for PNA and started on levaquin. A couple days later had red bumpy/itchy rash given steroid injection at UC. Rash returned and spread to involve legs/arms. Seen by PCP 8/16 and given another course of z pack and prednisone. Covid-19 negative.  Otherwise pertinent review of systems is negative.  Past Medical History:  Diagnosis Date   Amaurosis fugax of right eye 2020   Transient monocular vision loss--brief.  W/u unremarkable.  Neurologist rec'd '81mg'$  ASA qd and good control of OSA.   Chronic fatigue    Gout    Hay fever    Hypertriglyceridemia    Lumbar disc disease 02/10/2018   OSA (obstructive sleep apnea)    CPAP in the past but he could not tolerate this; eventually pt gave up on the process.  After 2nd sleep study, he could not afford the CPAP.     Osteoarthritis, multiple sites    knees, back, neck     Family History  Problem Relation Age of Onset   Pancreatic cancer Maternal Grandmother    Multiple sclerosis Maternal Grandfather    Healthy Mother    Healthy Father    Lung cancer Paternal Grandfather      Past Surgical History:  Procedure Laterality Date   APPENDECTOMY  05/24/1999   COLONOSCOPY  ? approx 2017   part of w/u for abd pain per pt's best recollection (Dr. Mann?)-->? polyps?  pt cannot recall any details   EXPLORATORY LAPAROTOMY  12/21/2020   lysis of adhesions   HOLTER MONITOR 48H  12/2018   NORMAL   TRANSTHORACIC ECHOCARDIOGRAM  09/2018   EF 55-60%, NO ABNORMALITIES    Social History   Socioeconomic History   Marital status: Married    Spouse name: Michael Hickman   Number of children: 1   Years of education: Not on file   Highest  education level: Associate degree: occupational, Hotel manager, or vocational program  Occupational History   Occupation: Music therapist: [employer]  Tobacco Use   Smoking status: Never   Smokeless tobacco: Never  Scientific laboratory technician Use: Never used  Substance and Sexual Activity   Alcohol use: No    Alcohol/week: 0.0 standard drinks of alcohol   Drug use: No   Sexual activity: Not on file  Other Topics Concern   Not on file  Social History Narrative   Married to [place], 1 daughter.   Orig from [place].   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   No tob.   No alc.        Patient is right-handed. He lives with his wife in a two level home. He drinks one cup of coffee, and one large glass of tea a day, he does not exercise.   Social Determinants of Health   Financial Resource Strain: Not on file  Food Insecurity: Not on file  Transportation Needs: Not on file  Physical Activity: Not on file  Stress: Not on file  Social Connections: Not on file  Intimate Partner Violence: Not on file     Allergies  Allergen Reactions   Penicillins      Outpatient Medications Prior to Visit  Medication Sig Dispense Refill   ipratropium-albuterol (DUONEB) 0.5-2.5 (3) MG/3ML SOLN Take by nebulization.     No facility-administered medications prior to visit.       Objective:   Physical Exam:  General appearance: 51 y.o., male, NAD, conversant  Eyes: anicteric sclerae; PERRL, tracking appropriately HENT: NCAT; MMM Neck: Trachea midline; no lymphadenopathy, no JVD Lungs: CTAB, no crackles, no wheeze, with normal respiratory effort CV: RRR, no murmur  Abdomen: Soft, non-tender; non-distended, BS present  Extremities: No peripheral edema, warm Skin: Normal turgor and texture; no rash Psych: Appropriate affect Neuro: Alert and oriented to person and place, no focal deficit     There were no vitals filed for this visit.   on *** LPM *** RA BMI  Readings from Last 3 Encounters:  08/29/22 32.52 kg/m  08/15/22 31.49 kg/m  11/07/21 32.50 kg/m   Wt Readings from Last 3 Encounters:  08/29/22 233 lb 3.2 oz (105.8 kg)  08/15/22 225 lb 12.8 oz (102.4 kg)  11/07/21 233 lb (105.7 kg)     CBC    Component Value Date/Time   WBC 10.9 (H) 08/08/2020 1548   RBC 5.14 08/08/2020 1548   HGB 15.3 08/08/2020 1548   HCT 43.5 08/08/2020 1548   PLT 269.0 08/08/2020 1548   MCV 84.6 08/08/2020 1548   MCHC 35.1 08/08/2020 1548   RDW 13.4 08/08/2020 1548   LYMPHSABS 2.9 08/08/2020 1548   MONOABS 0.6 08/08/2020 1548   EOSABS 0.1 08/08/2020 1548   BASOSABS 0.1 08/08/2020 1548    ***  Chest Imaging: CXR 08/11/22 report reviewed by me with "Streaky left basilar airspace disease, which may reflect aspiration or infection."  Pulmonary Functions Testing Results:     No data to display          FeNO: ***  Pathology: ***  Echocardiogram: ***  Heart Catheterization: ***    Assessment & Plan:    Plan:      Maryjane Hurter, MD [facility] 09/04/2022 5:41 PM

## 2022-09-05 ENCOUNTER — Encounter: Payer: Self-pay | Admitting: Student

## 2022-09-05 ENCOUNTER — Ambulatory Visit (INDEPENDENT_AMBULATORY_CARE_PROVIDER_SITE_OTHER): Payer: 59 | Admitting: Student

## 2022-09-05 VITALS — BP 104/62 | HR 85 | Temp 98.4°F | Ht 71.0 in | Wt 230.8 lb

## 2022-09-05 DIAGNOSIS — R911 Solitary pulmonary nodule: Secondary | ICD-10-CM | POA: Diagnosis not present

## 2022-09-05 DIAGNOSIS — R21 Rash and other nonspecific skin eruption: Secondary | ICD-10-CM | POA: Diagnosis not present

## 2022-09-05 NOTE — Patient Instructions (Signed)
-   over the counter delsym as needed for cough - you'll be called to schedule CT Chest and dermatology referral - see you in 6 weeks or so after the CT

## 2022-09-06 ENCOUNTER — Other Ambulatory Visit: Payer: Self-pay | Admitting: Student

## 2022-09-06 DIAGNOSIS — R911 Solitary pulmonary nodule: Secondary | ICD-10-CM

## 2022-10-16 ENCOUNTER — Ambulatory Visit: Payer: 59 | Admitting: Family Medicine

## 2022-10-16 NOTE — Progress Notes (Deleted)
OFFICE VISIT  10/16/2022  CC: No chief complaint on file.   Patient is a 51 y.o. male who presents for stomach pain.  HPI: ***   Past Medical History:  Diagnosis Date   Amaurosis fugax of right eye 2020   Transient monocular vision loss--brief.  W/u unremarkable.  Neurologist rec'd '81mg'$  ASA qd and good control of OSA.   Chronic fatigue    Gout    Hay fever    Hypertriglyceridemia    Lumbar disc disease 02/10/2018   OSA (obstructive sleep apnea)    CPAP in the past but he could not tolerate this; eventually pt gave up on the process.  After 2nd sleep study, he could not afford the CPAP.     Osteoarthritis, multiple sites    knees, back, neck    Past Surgical History:  Procedure Laterality Date   APPENDECTOMY  05/24/1999   COLONOSCOPY  ? approx 2017   part of w/u for abd pain per pt's best recollection (Dr. Mann?)-->? polyps?  pt cannot recall any details   EXPLORATORY LAPAROTOMY  12/21/2020   lysis of adhesions   HOLTER MONITOR 48H  12/2018   NORMAL   TRANSTHORACIC ECHOCARDIOGRAM  09/2018   EF 55-60%, NO ABNORMALITIES    Outpatient Medications Prior to Visit  Medication Sig Dispense Refill   hydrocortisone cream 1 % Apply 1 Application topically 2 (two) times daily.     No facility-administered medications prior to visit.    Allergies  Allergen Reactions   Penicillins     ROS As per HPI  PE:    09/05/2022    3:58 PM 08/29/2022    4:11 PM 08/15/2022   10:18 AM  Vitals with BMI  Height '5\' 11"'$  '5\' 11"'$  '5\' 11"'$   Weight 230 lbs 13 oz 233 lbs 3 oz 225 lbs 13 oz  BMI 32.2 62.69 48.54  Systolic 627 035 009  Diastolic 62 70 75  Pulse 85 91 93   Physical Exam  ***  LABS:  Last CBC Lab Results  Component Value Date   WBC 10.9 (H) 08/08/2020   HGB 15.3 08/08/2020   HCT 43.5 08/08/2020   MCV 84.6 08/08/2020   RDW 13.4 08/08/2020   PLT 269.0 38/18/2993   Last metabolic panel Lab Results  Component Value Date   GLUCOSE 96 08/08/2020   NA 141  08/08/2020   K 3.8 08/08/2020   CL 106 08/08/2020   CO2 26 08/08/2020   BUN 15 08/08/2020   CREATININE 1.27 08/08/2020   CALCIUM 9.3 08/08/2020   PROT 7.2 08/08/2020   ALBUMIN 4.3 08/08/2020   BILITOT 0.5 08/08/2020   ALKPHOS 60 08/08/2020   AST 17 08/08/2020   ALT 21 08/08/2020   IMPRESSION AND PLAN:  No problem-specific Assessment & Plan notes found for this encounter.   An After Visit Summary was printed and given to the patient.  FOLLOW UP: No follow-ups on file.  Signed:  Crissie Sickles, MD           10/16/2022

## 2022-10-17 ENCOUNTER — Ambulatory Visit
Admission: RE | Admit: 2022-10-17 | Discharge: 2022-10-17 | Disposition: A | Discharge status: Home / Self Care | Payer: 59 | Source: Ambulatory Visit | Attending: Student | Admitting: Student

## 2022-10-17 DIAGNOSIS — R911 Solitary pulmonary nodule: Secondary | ICD-10-CM

## 2022-10-23 LAB — HM COLONOSCOPY

## 2022-10-30 ENCOUNTER — Ambulatory Visit (INDEPENDENT_AMBULATORY_CARE_PROVIDER_SITE_OTHER): Payer: 59 | Admitting: Family Medicine

## 2022-10-30 ENCOUNTER — Encounter: Payer: Self-pay | Admitting: Family Medicine

## 2022-10-30 VITALS — BP 127/75 | HR 87 | Temp 97.8°F | Wt 230.6 lb

## 2022-10-30 DIAGNOSIS — L03213 Periorbital cellulitis: Secondary | ICD-10-CM | POA: Diagnosis not present

## 2022-10-30 NOTE — Progress Notes (Signed)
OFFICE VISIT  10/30/2022  CC:  Chief Complaint  Patient presents with   Facial Swelling    Started last week. Taken abx from UC has not helped went to UC this morning changed Abx   Patient is a 51 y.o. male who presents for facial swelling.  HPI: 3 days ago he developed a couple of red bumps on the right temple.  He started to get swelling over the right preauricular area extending down the right side of his neck.  Felt like lymph node swelling. He presented to an urgent care and was prescribed doxycycline and mupirocin ointment. Over the next 36 hours he felt like the right side of his face and the neck swelling improved but he noticed extension of the swelling from his temple over the right eyelid. He denies any pain in the eye or temple or face.  No pain with eye movement. No fever or malaise.  No vision abnormality.  Because of the worsening noted above he returned to the urgent care and his doxycycline was discontinued and he was started on clindamycin 450 mg 3 times daily x7 days.  He took his first dose this morning.    He does have a remote history of a infection around the left elbow that was believed to be staph.  Past Medical History:  Diagnosis Date   Amaurosis fugax of right eye 2020   Transient monocular vision loss--brief.  W/u unremarkable.  Neurologist rec'd '81mg'$  ASA qd and good control of OSA.   Chronic fatigue    Gout    Hay fever    Hypertriglyceridemia    Lumbar disc disease 02/10/2018   OSA (obstructive sleep apnea)    CPAP in the past but he could not tolerate this; eventually pt gave up on the process.  After 2nd sleep study, he could not afford the CPAP.     Osteoarthritis, multiple sites    knees, back, neck    Past Surgical History:  Procedure Laterality Date   APPENDECTOMY  05/24/1999   COLONOSCOPY  ? approx 2017   part of w/u for abd pain per pt's best recollection (Dr. Mann?)-->? polyps?  pt cannot recall any details   EXPLORATORY LAPAROTOMY   12/21/2020   lysis of adhesions   HOLTER MONITOR 48H  12/2018   NORMAL   TRANSTHORACIC ECHOCARDIOGRAM  09/2018   EF 55-60%, NO ABNORMALITIES    Outpatient Medications Prior to Visit  Medication Sig Dispense Refill   hydrocortisone cream 1 % Apply 1 Application topically 2 (two) times daily.     No facility-administered medications prior to visit.    Allergies  Allergen Reactions   Penicillins     ROS As per HPI  PE:    10/30/2022   11:18 AM 09/05/2022    3:58 PM 08/29/2022    4:11 PM  Vitals with BMI  Height  '5\' 11"'$  '5\' 11"'$   Weight 230 lbs 10 oz 230 lbs 13 oz 233 lbs 3 oz  BMI  19.4 17.40  Systolic 814 481 856  Diastolic 75 62 70  Pulse 87 85 91     Physical Exam  Alert and well-appearing. Right temple with 2-3 small pinkish papules, superficial ulceration noted.  No active exudate.  No palpable subcutaneous fluid collection.  No surrounding erythema.  This is nontender. Right upper eyelid swelling and erythema is mild to moderate.  No palpable nodule.  PERRLA.  Extraocular movements intact and without pain. No bulbar conjunctival injection. No eye drainage. Mild  tenderness to palpation over the right preauricular region as well as the right angle of mandible extending down of the right cervical region.  No palpable adenopathy, though.  LABS:  Last CBC Lab Results  Component Value Date   WBC 10.9 (H) 08/08/2020   HGB 15.3 08/08/2020   HCT 43.5 08/08/2020   MCV 84.6 08/08/2020   RDW 13.4 08/08/2020   PLT 269.0 60/67/7034   Last metabolic panel Lab Results  Component Value Date   GLUCOSE 96 08/08/2020   NA 141 08/08/2020   K 3.8 08/08/2020   CL 106 08/08/2020   CO2 26 08/08/2020   BUN 15 08/08/2020   CREATININE 1.27 08/08/2020   CALCIUM 9.3 08/08/2020   PROT 7.2 08/08/2020   ALBUMIN 4.3 08/08/2020   BILITOT 0.5 08/08/2020   ALKPHOS 60 08/08/2020   AST 17 08/08/2020   ALT 21 08/08/2020    IMPRESSION AND PLAN:  Preseptal cellulitis. Doxy  changed to clinda today due slight worsening. He is on max dosing 3 times daily x7 days, first dose already taken.  He has a penicillin allergy. There is a small chance that the mupirocin ointment he has been applying to the temple has caused a bit of a local reaction.  Signs/symptoms to go to the ED for were reviewed and pt expressed understanding.  He can send a MyChart message with a picture if he is unsure about anything in the next day or 2, or he can call for appointment and I will work him in.  An After Visit Summary was printed and given to the patient.  FOLLOW UP: No follow-ups on file.  Signed:  Crissie Sickles, MD           10/30/2022

## 2022-11-01 NOTE — Progress Notes (Signed)
Synopsis: Referred for pneumonia by Tammi Sou, MD  Subjective:   PATIENT ID: Michael Hickman GENDER: male DOB: [birth year], MRN: [id number]  Chief Complaint  Patient presents with   Follow-up   71yM with history of OSA not on CPAP referred for pneumonia  A month ago had sore throat, severe HA, fever, cough, wheeze, had CXR concerning for PNA and started on levaquin. A couple days later had red bumpy/itchy rash given steroid injection at UC. Rash returned and spread to involve legs/arms. Seen by PCP 8/16 and given another course of z pack and prednisone. Rash is unimproved after steroid course. Hydrocortisone to rash helps him sleep but hasn't really changed extent of it. Covid-19 negative.  Still coughing. Occasionally productive. No fever. CP. Still has rash. He has no DOE.  He says he had some kind of scan recently at an imaging center in [place] but doesn't remember what the finding was (?nodule), whether it was CT or MRI. Can't find any such imaging in [place].   This is first bout of pneumonia that he's ever had.   He has no family history of lung disease  Does have brake dust exposure from cars. He is a Dealer - welding fumes, etc. Never smoked. No vaping, MJ.  Interval HPI Small =<29m nodules on CT Chest.   His cough is much improved. No significant DOE.  Recent bout of of norovirus.    Otherwise pertinent review of systems is negative.  Past Medical History:  Diagnosis Date   Amaurosis fugax of right eye 2020   Transient monocular vision loss--brief.  W/u unremarkable.  Neurologist rec'd '81mg'$  ASA qd and good control of OSA.   Chronic fatigue    Gout    Hay fever    Hypertriglyceridemia    Lumbar disc disease 02/10/2018   OSA (obstructive sleep apnea)    CPAP in the past but he could not tolerate this; eventually pt gave up on the process.  After 2nd sleep study, he could not afford the CPAP.     Osteoarthritis, multiple sites    knees, back, neck      Family History  Problem Relation Age of Onset   Pancreatic cancer Maternal Grandmother    Multiple sclerosis Maternal Grandfather    Healthy Mother    Healthy Father    Lung cancer Paternal Grandfather      Past Surgical History:  Procedure Laterality Date   APPENDECTOMY  05/24/1999   COLONOSCOPY  ? approx 2017   part of w/u for abd pain per pt's best recollection (Dr. Mann?)-->? polyps?  pt cannot recall any details   EXPLORATORY LAPAROTOMY  12/21/2020   lysis of adhesions   HOLTER MONITOR 48H  12/2018   NORMAL   TRANSTHORACIC ECHOCARDIOGRAM  09/2018   EF 55-60%, NO ABNORMALITIES    Social History   Socioeconomic History   Marital status: Married    Spouse name: [name]   Number of children: 1   Years of education: Not on file   Highest education level: Associate degree: occupational, tHotel manager or vocational program  Occupational History   Occupation: [employer] therapist [employer]  Tobacco Use   Smoking status: Never   Smokeless tobacco: Never  VScientific laboratory technicianUse: Never used  Substance and Sexual Activity   Alcohol use: No    Alcohol/week: 0.0 standard drinks of alcohol   Drug use: No   Sexual activity: Not on file  Other Topics  Concern   Not on file  Social History Narrative   Married to [place], 1 daughter.   Orig from [place].   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   No tob.   No alc.        Patient is right-handed. He lives with his wife in a two level home. He drinks one cup of coffee, and one large glass of tea a day, he does not exercise.   Social Determinants of Health   Financial Resource Strain: Not on file  Food Insecurity: Not on file  Transportation Needs: Not on file  Physical Activity: Not on file  Stress: Not on file  Social Connections: Not on file  Intimate Partner Violence: Not on file     Allergies  Allergen Reactions   Penicillins      Outpatient Medications Prior to Visit   Medication Sig Dispense Refill   hydrocortisone cream 1 % Apply 1 Application topically 2 (two) times daily. (Patient not taking: Reported on 11/06/2022)     No facility-administered medications prior to visit.       Objective:   Physical Exam:  General appearance: 51 y.o., male, NAD, conversant  Eyes: anicteric sclerae; PERRL, tracking appropriately HENT: NCAT; MMM Neck: Trachea midline; no lymphadenopathy, no JVD Lungs: CTAB, no crackles, no wheeze, with normal respiratory effort CV: RRR, no murmur  Abdomen: Soft, non-tender; non-distended, BS present  Extremities: No peripheral edema, warm Skin: Normal turgor and texture; no rash Psych: Appropriate affect Neuro: Alert and oriented to person and place, no focal deficit     Vitals:   11/06/22 1533  BP: 122/76  Pulse: 83  SpO2: 97%  Weight: 233 lb 9.6 oz (106 kg)  Height: '5\' 11"'$  (1.803 m)    97% on RA BMI Readings from Last 3 Encounters:  11/06/22 32.58 kg/m  10/30/22 32.16 kg/m  09/05/22 32.19 kg/m   Wt Readings from Last 3 Encounters:  11/06/22 233 lb 9.6 oz (106 kg)  10/30/22 230 lb 9.6 oz (104.6 kg)  09/05/22 230 lb 12.8 oz (104.7 kg)     CBC    Component Value Date/Time   WBC 10.9 (H) 08/08/2020 1548   RBC 5.14 08/08/2020 1548   HGB 15.3 08/08/2020 1548   HCT 43.5 08/08/2020 1548   PLT 269.0 08/08/2020 1548   MCV 84.6 08/08/2020 1548   MCHC 35.1 08/08/2020 1548   RDW 13.4 08/08/2020 1548   LYMPHSABS 2.9 08/08/2020 1548   MONOABS 0.6 08/08/2020 1548   EOSABS 0.1 08/08/2020 1548   BASOSABS 0.1 08/08/2020 1548    Chest Imaging: CXR 08/11/22 report reviewed by me with "Streaky left basilar airspace disease, which may reflect aspiration or infection."  CT Chest 10/17/22 with a few small nodules =<31m  Pulmonary Functions Testing Results:     No data to display             Assessment & Plan:   # Pulmonary nodules: Hard to quantify his risk for cancer from his unique exposures,  probably worth repeating CT in a year. Also mentions father just found to possibly have IPF.   # OSA not on CPAP  Plan: - CT Chest in a year  - he'll think about whether or not he'd like to pursue sleep study/CPAP again   RTC 1 year  NMaryjane Hurter MD [facility] 11/06/2022 3:44 PM

## 2022-11-06 ENCOUNTER — Ambulatory Visit (INDEPENDENT_AMBULATORY_CARE_PROVIDER_SITE_OTHER): Payer: 59 | Admitting: Student

## 2022-11-06 ENCOUNTER — Encounter: Payer: Self-pay | Admitting: Student

## 2022-11-06 VITALS — BP 122/76 | HR 83 | Ht 71.0 in | Wt 233.6 lb

## 2022-11-06 DIAGNOSIS — R911 Solitary pulmonary nodule: Secondary | ICD-10-CM | POA: Diagnosis not present

## 2022-11-06 NOTE — Patient Instructions (Addendum)
-   you'll be called to schedule CT Chest in a year and I'll see you after that - let me know if you'd like to pursue sleep study to try CPAP again

## 2023-01-24 ENCOUNTER — Encounter: Payer: Self-pay | Admitting: Family Medicine

## 2023-01-24 ENCOUNTER — Ambulatory Visit (INDEPENDENT_AMBULATORY_CARE_PROVIDER_SITE_OTHER): Payer: 59 | Admitting: Family Medicine

## 2023-01-24 VITALS — BP 123/75 | HR 79 | Temp 98.1°F | Ht 71.0 in | Wt 235.0 lb

## 2023-01-24 DIAGNOSIS — M7711 Lateral epicondylitis, right elbow: Secondary | ICD-10-CM | POA: Diagnosis not present

## 2023-01-24 NOTE — Progress Notes (Signed)
OFFICE VISIT  01/24/2023  CC:  Chief Complaint  Patient presents with   Elbow Pain    Right x 1 month, some days he has limited ROM but discomfort keeps him up some nights. Has not taken any otc meds, denies swelling.    Patient is a 52 y.o. male who presents for right elbow pain.  HPI: Has had a month of right elbow pain, points to lateral epicondyle region.  Worse in the evening and when he is trying to sleep, throbs.  Not as noticeable in the daytime.  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  No elbow swelling or erythema.  Sometimes the pain is bad enough in the mornings when he tries to pick up his cup of coffee it makes it hurt extremely bad. He has been wearing a tennis elbow strap but has been wearing it directly over the elbow, it has not helped. He has not tried any medications for relative rest. No symptoms on the left side.   Past Medical History:  Diagnosis Date   Amaurosis fugax of right eye 2020   Transient monocular vision loss--brief.  W/u unremarkable.  Neurologist rec'd '81mg'$  ASA qd and good control of OSA.   Chronic fatigue    Gout    Hay fever    Hypertriglyceridemia    Lumbar disc disease 02/10/2018   OSA (obstructive sleep apnea)    CPAP in the past but he could not tolerate this; eventually pt gave up on the process.  After 2nd sleep study, he could not afford the CPAP.     Osteoarthritis, multiple sites    knees, back, neck    Past Surgical History:  Procedure Laterality Date   APPENDECTOMY  05/24/1999   COLONOSCOPY  ? approx 2017   part of w/u for abd pain per pt's best recollection (Dr. Mann?)-->? polyps?  pt cannot recall any details   EXPLORATORY LAPAROTOMY  12/21/2020   lysis of adhesions   HOLTER MONITOR 48H  12/2018   NORMAL   TRANSTHORACIC ECHOCARDIOGRAM  09/2018   EF 55-60%, NO ABNORMALITIES    Outpatient Medications Prior to Visit  Medication Sig Dispense Refill   hydrocortisone cream  1 % Apply 1 Application topically 2 (two) times daily.     No facility-administered medications prior to visit.    Allergies  Allergen Reactions   Penicillins     Review of Systems  As per HPI  PE:    01/24/2023   10:35 AM 11/06/2022    3:33 PM 10/30/2022   11:18 AM  Vitals with BMI  Height '5\' 11"'$  '5\' 11"'$    Weight 235 lbs 233 lbs 10 oz 230 lbs 10 oz  BMI 32.44 01.02   Systolic 725 366 440  Diastolic 75 76 75  Pulse 79 83 87     Physical Exam  Gen: Alert, well appearing.  Patient is oriented to person, place, time, and situation. Right elbow without swelling or erythema.  Minimal focal discomfort over the proximal extensor tendon complex. Resisted extension of the wrist does reproduce his symptoms.  Otherwise resisted range of motion of the elbow and wrist do not elicit pain.  Range of motion of the elbow is fully intact.  Strength 5 out of 5 in forearm and hand.  LABS:  Last CBC Lab Results  Component Value Date   WBC 10.9 (H) 08/08/2020   HGB 15.3 08/08/2020   HCT 43.5 08/08/2020   MCV  84.6 08/08/2020   RDW 13.4 08/08/2020   PLT 269.0 12/87/8676   Last metabolic panel Lab Results  Component Value Date   GLUCOSE 96 08/08/2020   NA 141 08/08/2020   K 3.8 08/08/2020   CL 106 08/08/2020   CO2 26 08/08/2020   BUN 15 08/08/2020   CREATININE 1.27 08/08/2020   CALCIUM 9.3 08/08/2020   PROT 7.2 08/08/2020   ALBUMIN 4.3 08/08/2020   BILITOT 0.5 08/08/2020   ALKPHOS 60 08/08/2020   AST 17 08/08/2020   ALT 21 08/08/2020   Lab Results  Component Value Date   LABURIC 6.7 04/24/2016   IMPRESSION AND PLAN:  Right elbow extensor tendinopathy. He has not been wearing his tennis elbow band correctly. Will have him wear wrap on the proximal forearm rather than directly over the elbow. Recommended over-the-counter Voltaren gel half to 1 g 3-4 times a day for the next week. The importance of relative rest was emphasized. Discussed the option of steroid injection if  not improving significantly over the next 2 weeks or if worsening prior to that.  (Bedside ultrasound today: Lateral epicondylar bony contour without irregularity, extensor tendon complex without any disruption of architecture or any hypoechoic or hyperechoic changes.  No hyperemia.  In the short axis it does appear the thickness is nearly doubled the thickness of the left extensor tendon complex. Radial collateral ligament is normal, radiocapitellar joint normal.)  An After Visit Summary was printed and given to the patient.  FOLLOW UP: Return if symptoms worsen or fail to improve.  Signed:  Crissie Sickles, MD           01/24/2023

## 2023-01-24 NOTE — Patient Instructions (Signed)
Buy a small tube of Voltaren gel and use 1/2 - 1 gram on the area of pain 3-4 times a day as needed.

## 2023-02-05 ENCOUNTER — Encounter: Payer: Self-pay | Admitting: Family Medicine

## 2023-02-05 ENCOUNTER — Ambulatory Visit (INDEPENDENT_AMBULATORY_CARE_PROVIDER_SITE_OTHER): Payer: 59 | Admitting: Family Medicine

## 2023-02-05 VITALS — Ht 71.0 in | Wt 236.0 lb

## 2023-02-05 DIAGNOSIS — M7711 Lateral epicondylitis, right elbow: Secondary | ICD-10-CM

## 2023-02-05 NOTE — Progress Notes (Addendum)
OFFICE VISIT  02/05/2023  CC:  Chief Complaint  Patient presents with   Elbow Pain    unchanged    Patient is a 52 y.o. male who presents for 2 wk f/u right elbow pain, extensor tendinopathy.  INTERIM HX: Conservative mgmt with strap, relative rest, voltaren gel-->not improving significantly. Focal pain lateral aspect R elbow continues, impairing use enough to affect job/quality of life, Wants to move forward with injection today.  Past Medical History:  Diagnosis Date   Amaurosis fugax of right eye 2020   Transient monocular vision loss--brief.  W/u unremarkable.  Neurologist rec'd '81mg'$  ASA qd and good control of OSA.   Chronic fatigue    Gout    Hay fever    Hypertriglyceridemia    Lumbar disc disease 02/10/2018   OSA (obstructive sleep apnea)    CPAP in the past but he could not tolerate this; eventually pt gave up on the process.  After 2nd sleep study, he could not afford the CPAP.     Osteoarthritis, multiple sites    knees, back, neck    Past Surgical History:  Procedure Laterality Date   APPENDECTOMY  05/24/1999   COLONOSCOPY  ? approx 2017   part of w/u for abd pain per pt's best recollection (Dr. Mann?)-->? polyps?  pt cannot recall any details   EXPLORATORY LAPAROTOMY  12/21/2020   lysis of adhesions   HOLTER MONITOR 48H  12/2018   NORMAL   TRANSTHORACIC ECHOCARDIOGRAM  09/2018   EF 55-60%, NO ABNORMALITIES    Outpatient Medications Prior to Visit  Medication Sig Dispense Refill   hydrocortisone cream 1 % Apply 1 Application topically 2 (two) times daily.     No facility-administered medications prior to visit.    Allergies  Allergen Reactions   Penicillins     Review of Systems As per HPI  PE:    02/05/2023    8:01 AM 01/24/2023   10:35 AM 11/06/2022    3:33 PM  Vitals with BMI  Height '5\' 11"'$  '5\' 11"'$  '5\' 11"'$   Weight 236 lbs 235 lbs 233 lbs 10 oz  BMI 32.93 [id number]  Systolic  [id number]  Diastolic  75 76  Pulse  79 83     Physical  Exam  Gen: Alert, well appearing.  Patient is oriented to person, place, time, and situation. R elbow w/out swelling, warmth, or erythema. Focal TTP over lateral epicondyle and common extensor tendon.  Pain with resisted supination of the forearm and resisted extension of the wrist.  LABS:  none  IMPRESSION AND PLAN:  Right elbow lateral epicondylitis/extensor tendinopathy. Failed conservative management.  Ultrasound-guided injection is preferred based on studies that show increased duration, increased effect, greater accuracy, decreased procedural pain, increased response rate, and decreased cost with ultrasound-guided versus blind injection. Procedure: Real-time ultrasound guided injection of right elbow common extensor tendon. Device: [employer] informed consent obtained.  Timeout conducted.  No overlying erythema, induration, or other signs of local infection. After sterile prep with Betadine, injected 3 cc of 2% lidocaine without epinephrine mixed with 40 mg methylprednisolone using 25-gauge and 1/2 inch needle, passing the needle from.  Injectate seen dispersing around and within common extensor tendon. Patient tolerated the procedure well.  No immediate complications.  Post-injection care discussed. Advised to call if fever/chills, erythema, drainage, or persistent bleeding.  Impression: Technically successful ultrasound-guided injection.  An After Visit Summary was printed and given to the patient.  FOLLOW UP: Return if symptoms worsen  or fail to improve.  Signed:  Crissie Sickles, MD           02/05/2023

## 2023-02-21 DIAGNOSIS — M7711 Lateral epicondylitis, right elbow: Secondary | ICD-10-CM | POA: Diagnosis not present

## 2023-02-21 MED ORDER — METHYLPREDNISOLONE ACETATE 40 MG/ML IJ SUSP
40.0000 mg | Freq: Once | INTRAMUSCULAR | Status: AC
  Administered 2023-02-21: 40 mg via INTRA_ARTICULAR

## 2023-02-21 NOTE — Addendum Note (Signed)
Addended by: Deveron Furlong D on: 02/21/2023 04:16 PM   Modules accepted: Orders

## 2023-02-27 ENCOUNTER — Encounter: Payer: Self-pay | Admitting: Family Medicine

## 2023-02-27 ENCOUNTER — Ambulatory Visit (INDEPENDENT_AMBULATORY_CARE_PROVIDER_SITE_OTHER): Payer: 59 | Admitting: Family Medicine

## 2023-02-27 VITALS — BP 111/76 | HR 86 | Temp 99.1°F | Wt 233.2 lb

## 2023-02-27 DIAGNOSIS — B9689 Other specified bacterial agents as the cause of diseases classified elsewhere: Secondary | ICD-10-CM

## 2023-02-27 DIAGNOSIS — R0981 Nasal congestion: Secondary | ICD-10-CM | POA: Diagnosis not present

## 2023-02-27 DIAGNOSIS — R051 Acute cough: Secondary | ICD-10-CM

## 2023-02-27 DIAGNOSIS — J329 Chronic sinusitis, unspecified: Secondary | ICD-10-CM | POA: Diagnosis not present

## 2023-02-27 LAB — POCT INFLUENZA A/B
Influenza A, POC: NEGATIVE
Influenza B, POC: NEGATIVE

## 2023-02-27 LAB — POC COVID19 BINAXNOW: SARS Coronavirus 2 Ag: NEGATIVE

## 2023-02-27 MED ORDER — DOXYCYCLINE HYCLATE 100 MG PO TABS: 100.0000 mg | ORAL_TABLET | Freq: Two times a day (BID) | ORAL | 0 refills | Status: DC

## 2023-02-27 NOTE — Patient Instructions (Addendum)
Return in about 2 weeks (around 03/13/2023), or if symptoms worsen or fail to improve, for w/ PCP.        Great to see you today.

## 2023-02-27 NOTE — Progress Notes (Signed)
Michael Hickman , Feb 01, 1971, 52 y.o., male MRN: [id number] Patient Care Team    Relationship Specialty Notifications Start End  McGowen, Adrian Blackwater, MD PCP - General Family Medicine  12/19/18   Pieter Partridge, [place] Physician Neurology  03/02/19   Nevada Crane, MD Consulting Physician Rheumatology  10/19/21   [facility]  Gastroenterology  02/05/23     Chief Complaint  Patient presents with   Cough    Onset Friday; sinus pressure/congestion, low grade fever     Subjective: Michael Hickman is a 52 y.o. Pt presents for an OV with complaints of sinus pain and pressure of 6 days duration.  Associated symptoms include low grade fever and nasal congestion. He reports he felt like he was getting better earlier yesterday, but this morning all of the symptoms rebound and more severe. He reports rather significant right sided sinus pain pressure. His wife was ill also, but was able to recover quickly.   Pt has tried nothing to ease their symptoms.  He has a pmh of pneumonia.      10/30/2022   11:19 AM 10/19/2021    3:38 PM 02/10/2018    4:07 PM  Depression screen PHQ 2/9  Decreased Interest 0 0 0  Down, Depressed, Hopeless 0 0 0  PHQ - 2 Score 0 0 0    Allergies  Allergen Reactions   Penicillins    Levaquin [Levofloxacin] Rash   Social History   Social History Narrative   Married to Michael Hickman, 1 daughter.   Orig from [place].   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   No tob.   No alc.        Patient is right-handed. He lives with his wife in a two level home. He drinks one cup of coffee, and one large glass of tea a day, he does not exercise.   Past Medical History:  Diagnosis Date   Amaurosis fugax of right eye 2020   Transient monocular vision loss--brief.  W/u unremarkable.  Neurologist rec'd '81mg'$  ASA qd and good control of OSA.   Chronic fatigue    Gout    Hay fever    Hypertriglyceridemia    Lumbar disc disease 02/10/2018   OSA  (obstructive sleep apnea)    CPAP in the past but he could not tolerate this; eventually pt gave up on the process.  After 2nd sleep study, he could not afford the CPAP.     Osteoarthritis, multiple sites    knees, back, neck   Past Surgical History:  Procedure Laterality Date   APPENDECTOMY  05/24/1999   COLONOSCOPY  ? approx 2017   part of w/u for abd pain per pt's best recollection (Dr. Mann?)-->? polyps?  pt cannot recall any details   EXPLORATORY LAPAROTOMY  12/21/2020   lysis of adhesions   HOLTER MONITOR 48H  12/2018   NORMAL   TRANSTHORACIC ECHOCARDIOGRAM  09/2018   EF 55-60%, NO ABNORMALITIES   Family History  Problem Relation Age of Onset   Pancreatic cancer Maternal Grandmother    Multiple sclerosis Maternal Grandfather    Healthy Mother    Healthy Father    Lung cancer Paternal Grandfather    Allergies as of 02/27/2023       Reactions   Penicillins    Levaquin [levofloxacin] Rash        Medication List        Accurate as of February 27, 2023 11:22 AM.  If you have any questions, ask your nurse or doctor.          doxycycline 100 MG tablet Commonly known as: VIBRA-TABS Take 1 tablet (100 mg total) by mouth in the morning and at bedtime. Started by: Howard Pouch, DO   hydrocortisone cream 1 % Apply 1 Application topically 2 (two) times daily.        All past medical history, surgical history, allergies, family history, immunizations andmedications were updated in the EMR today and reviewed under the history and medication portions of their EMR.     ROS Negative, with the exception of above mentioned in HPI   Objective:  BP 111/76   Pulse 86   Temp 99.1 F (37.3 C)   Wt 233 lb 3.2 oz (105.8 kg)   SpO2 98%   BMI 32.52 kg/m  Body mass index is 32.52 kg/m. Physical Exam Vitals and nursing note reviewed.  Constitutional:      General: He is not in acute distress.    Appearance: Normal appearance. He is not ill-appearing, toxic-appearing  or diaphoretic.  HENT:     Head: Normocephalic and atraumatic.     Right Ear: Tympanic membrane, ear canal and external ear normal.     Left Ear: Tympanic membrane, ear canal and external ear normal.     Ears:     Comments: Bilateral effusions present, no erythema.     Nose: Congestion and rhinorrhea present.     Mouth/Throat:     Pharynx: No oropharyngeal exudate or posterior oropharyngeal erythema.  Eyes:     General: No scleral icterus.       Right eye: No discharge.        Left eye: No discharge.     Extraocular Movements: Extraocular movements intact.     Pupils: Pupils are equal, round, and reactive to light.  Cardiovascular:     Rate and Rhythm: Normal rate.  Pulmonary:     Effort: Pulmonary effort is normal. No respiratory distress.     Breath sounds: Normal breath sounds. No wheezing, rhonchi or rales.  Musculoskeletal:     Cervical back: Neck supple. Tenderness present.  Lymphadenopathy:     Cervical: Cervical adenopathy present.  Skin:    General: Skin is warm and dry.     Coloration: Skin is not jaundiced or pale.     Findings: No rash.  Neurological:     Mental Status: He is alert and oriented to person, place, and time. Mental status is at baseline.  Psychiatric:        Mood and Affect: Mood normal.        Behavior: Behavior normal.        Thought Content: Thought content normal.        Judgment: Judgment normal.    No results found. No results found. Results for orders placed or performed in visit on 02/27/23 (from the past 24 hour(s))  POC COVID-19 BinaxNow     Status: None   Collection Time: 02/27/23 11:22 AM  Result Value Ref Range   SARS Coronavirus 2 Ag Negative Negative  POCT Influenza A/B     Status: None   Collection Time: 02/27/23 11:22 AM  Result Value Ref Range   Influenza A, POC Negative Negative   Influenza B, POC Negative Negative    Assessment/Plan: Michael Hickman is a 52 y.o. male present for OV for  Acute cough/Congestion of  nasal sinus/Bacterial sinusitis - POC COVID-19 BinaxNow - POCT Influenza A/B Rest,  hydrate.  +/- flonase, mucinex (DM if cough), nettie pot or nasal saline.  Doxy bid (with food) prescribed, take until completed.  Work excuse provided.  F/U 2 weeks if not improved.   Reviewed expectations re: course of current medical issues. Discussed self-management of symptoms. Outlined signs and symptoms indicating need for more acute intervention. Patient verbalized understanding and all questions were answered. Patient received an After-Visit Summary.    Orders Placed This Encounter  Procedures   POC COVID-19 BinaxNow   POCT Influenza A/B   Meds ordered this encounter  Medications   doxycycline (VIBRA-TABS) 100 MG tablet    Sig: Take 1 tablet (100 mg total) by mouth in the morning and at bedtime.    Dispense:  20 tablet    Refill:  0   Referral Orders  No referral(s) requested today     Note is dictated utilizing voice recognition software. Although note has been proof read prior to signing, occasional typographical errors still can be missed. If any questions arise, please do not hesitate to call for verification.   electronically signed by:  Howard Pouch, DO  [place]

## 2023-11-04 ENCOUNTER — Ambulatory Visit
Admission: RE | Admit: 2023-11-04 | Discharge: 2023-11-04 | Disposition: A | Discharge status: Home / Self Care | Payer: 59 | Source: Ambulatory Visit | Attending: Student

## 2023-11-04 DIAGNOSIS — R911 Solitary pulmonary nodule: Secondary | ICD-10-CM

## 2023-11-15 ENCOUNTER — Telehealth: Payer: Self-pay | Admitting: Student

## 2023-11-15 NOTE — Telephone Encounter (Signed)
Patient requesting CT results, informed patient radiologist has not resulted it and we would call when the results come in.

## 2023-11-18 NOTE — Telephone Encounter (Signed)
Left voicemail for patient to call back to schedule appointment, held slot for tomorrow, 11/19 at 2pm with Buelah Manis, NP

## 2023-11-18 NOTE — Telephone Encounter (Signed)
Pt needs a OV with a NP to get established with a new doctor. Ct scheduled by Dr.Meier.

## 2023-11-18 NOTE — Telephone Encounter (Signed)
Patient is scheduled 11/19 at 2pm with Buelah Manis, NP

## 2023-11-19 ENCOUNTER — Ambulatory Visit (INDEPENDENT_AMBULATORY_CARE_PROVIDER_SITE_OTHER): Payer: 59 | Admitting: Primary Care

## 2023-11-19 ENCOUNTER — Encounter: Payer: Self-pay | Admitting: Primary Care

## 2023-11-19 VITALS — BP 134/82 | HR 85 | Temp 98.2°F | Ht 71.0 in | Wt 239.2 lb

## 2023-11-19 DIAGNOSIS — R911 Solitary pulmonary nodule: Secondary | ICD-10-CM

## 2023-11-19 DIAGNOSIS — G4733 Obstructive sleep apnea (adult) (pediatric): Secondary | ICD-10-CM

## 2023-11-19 NOTE — Patient Instructions (Addendum)
CT chest showed stable (unchanged) small bilateral pulmonary nodules, largest measuring 0.5cm. Nodules are considered benign. Diffuse hepatic steatosis (fatty liver). Follow up with primary care regarding this non-urgent finding.   Follow-up As needed if you develop respiratory symptoms    Pulmonary Nodule  A pulmonary nodule is a small, round growth of tissue in the lung. It is sometimes referred to as a shadow or a spot on the lung. Nodules can vary in size, and most measure less than  of an inch (10 mm). Nodules bigger than 1.2 inches (3 cm) are called lung masses. A pulmonary nodule is sometimes found during a routine chest X-ray or while other imaging tests are done to check for other problems. Pulmonary nodules can be either noncancerous (benign) or cancerous (malignant). Most are noncancerous. Smaller nodules in people who do not smoke and who do not have any other risk factors for lung cancer are more likely to be noncancerous. Larger, irregular nodules in people who smoke or who have a strong family history of lung cancer are more likely to be cancerous. What are the causes? This condition may be caused by: A bacterial, fungal, or viral infection, such as tuberculosis. The infection is usually an old and inactive one. Cancerous tissue, such as lung cancer or a cancer in another part of the body that has spread to the lung. A noncancerous mass of tissue. Inflammation from conditions such as rheumatoid arthritis. Abnormal blood vessels in the lungs. What are the signs or symptoms? Usually, there are no symptoms of this condition. If symptoms appear, they are usually related to the underlying cause. For example, if the condition is caused by an infection, you may have a cough or a fever. How is this diagnosed? This condition is usually diagnosed with an X-ray or CT scan. To help determine whether a pulmonary nodule is benign or malignant, your health care provider will: Take your medical  history. Perform a physical exam. Order tests. These may include: Chest X-rays. A CT scan. This test shows smaller pulmonary nodules more clearly and with more detail than an X-ray. A positron emission tomography (PET) scan. This test is done to check if a nodule is cancerous. During the test, a small amount of a radioactive substance is injected into the bloodstream. Then a picture is taken. Biopsy to evaluate for cancer or confirm a diagnosis. This procedure involves removing a tissue sample from the nodule by inserting a needle through the chest, placing a scope down into the lung, or doing open surgery. A skin test called a tuberculin test. This test is done to check if you have been exposed to the germ that causes tuberculosis. Blood tests. How is this treated? Treatment for this condition depends on whether the pulmonary nodule is malignant or benign. It also depends on your risk of getting cancer. Noncancerous nodules usually do not need to be treated, but they may need to be monitored with CT scans. If a CT scan shows that the pulmonary nodule got bigger, more tests may be done. Nodules that are found to be cancerous after a biopsy require treatment depending on the type and stage of the cancer. You will need more diagnostic tests, such as CT and PET scans, to determine the stage of the cancer. Treatments for cancer can include: Surgery. Radiation therapy. Chemotherapy. Immunotherapy. Some nodules need to be removed. If you need a nodule removed, you may have a procedure called a thoracotomy. During the procedure, your health care provider will  make an incision in your chest and remove the part of the lung where the nodule is located. Follow these instructions at home: Take over-the-counter and prescription medicines only as told by your health care provider. Do not use any products that contain nicotine or tobacco. These products include cigarettes, chewing tobacco, and vaping devices,  such as e-cigarettes. If you need help quitting, ask your health care provider. Keep all follow-up visits. This is important. Contact a health care provider if: You have pain in your chest, back, or shoulder. You are short of breath or have trouble breathing when you are active. You develop a cough, or you develop hoarseness for an unexplained reason. You feel sick or unusually tired. You do not feel like eating or you lose weight without trying. You develop chills or night sweats. You need two or more pillows to sleep on at night. You have any of these problems: A fever and symptoms that suddenly get worse. A fever or persistent symptoms for more than 2-3 days. Get help right away if: You cannot catch your breath. You have sudden chest pain. You start making high-pitched whistling sounds when you breathe, most often when you breathe out (you wheeze). You cannot stop coughing, or you cough up blood or bloody mucus from your lungs (sputum). You become dizzy or feel like you may faint. These symptoms may represent a serious problem that is an emergency. Do not wait to see if the symptoms will go away. Get medical help right away. Call your local emergency services (911 in the [place] Do not drive yourself to the hospital. Summary A pulmonary nodule is a small, round growth of tissue in the lung. Most pulmonary nodules are noncancerous. Common causes of pulmonary nodules include infection, inflammation, and noncancerous growths. This condition is usually diagnosed with an X-ray or CT scan. Treatment for this condition depends on whether the pulmonary nodule is malignant or benign. It also depends on your risk of getting cancer. If a nodule is found to be cancerous, you will need specific diagnostic tests and treatment options as told by your health care provider. This information is not intended to replace advice given to you by your health care provider. Make sure you discuss any questions you  have with your health care provider. Document Revised: 07/06/2020 Document Reviewed: 07/06/2020 Elsevier Patient Education  2024 [employer]   Fatty Liver Disease  The liver converts food into energy, removes toxic material from the blood, makes important proteins, and absorbs necessary vitamins from food. Fatty liver disease occurs when too much fat has built up in your liver cells. Fatty liver disease is also called hepatic steatosis. In many cases, fatty liver disease does not cause symptoms or problems. It is often diagnosed when tests are being done for other reasons. However, over time, fatty liver can cause inflammation that may lead to more serious liver problems, such as scarring of the liver (cirrhosis) and liver failure. Fatty liver is associated with insulin resistance, increased body fat, high blood pressure (hypertension), and high cholesterol. These are features of metabolic syndrome and increase your risk for stroke, diabetes, and heart disease. What are the causes? This condition may be caused by components of metabolic syndrome: Obesity. Insulin resistance. High cholesterol. Other causes: Alcohol abuse. Poor nutrition. Cushing syndrome. Pregnancy. Certain drugs. Poisons. Some viral infections. What increases the risk? You are more likely to develop this condition if you: Abuse alcohol. Are overweight. Have diabetes. Have hepatitis. Have a high triglyceride level.  Are pregnant. What are the signs or symptoms? Fatty liver disease often does not cause symptoms. If symptoms do develop, they can include: Fatigue and weakness. Weight loss. Confusion. Nausea, vomiting, or abdominal pain. Yellowing of your skin and the white parts of your eyes (jaundice). Itchy skin. How is this diagnosed? This condition may be diagnosed by: A physical exam and your medical history. Blood tests. Imaging tests, such as an ultrasound, CT scan, or MRI. A liver biopsy. A small  sample of liver tissue is removed using a needle. The sample is then looked at under a microscope. How is this treated? Fatty liver disease is often caused by other health conditions. Treatment for fatty liver may involve medicines and lifestyle changes to manage conditions such as: Alcoholism. High cholesterol. Diabetes. Being overweight or obese. Follow these instructions at home:  Do not drink alcohol. If you have trouble quitting, ask your health care provider how to safely quit with the help of medicine or a supervised program. This is important to keep your condition from getting worse. Eat a healthy diet as told by your health care provider. Ask your health care provider about working with a dietitian to develop an eating plan. Exercise regularly. This can help you lose weight and control your cholesterol and diabetes. Talk to your health care provider about an exercise plan and which activities are best for you. Take over-the-counter and prescription medicines only as told by your health care provider. Keep all follow-up visits. This is important. Contact a health care provider if: You have trouble controlling your: Blood sugar. This is especially important if you have diabetes. Cholesterol. Drinking of alcohol. Get help right away if: You have abdominal pain. You have jaundice. You have nausea and are vomiting. You vomit blood or material that looks like coffee grounds. You have stools that are black, tar-like, or bloody. Summary Fatty liver disease develops when too much fat builds up in the cells of your liver. Fatty liver disease often causes no symptoms or problems. However, over time, fatty liver can cause inflammation that may lead to more serious liver problems, such as scarring of the liver (cirrhosis). You are more likely to develop this condition if you abuse alcohol, are pregnant, are overweight, have diabetes, have hepatitis, or have high triglyceride or cholesterol  levels. Contact your health care provider if you have trouble controlling your blood sugar, cholesterol, or drinking of alcohol. This information is not intended to replace advice given to you by your health care provider. Make sure you discuss any questions you have with your health care provider. Document Revised: 09/29/2020 Document Reviewed: 09/29/2020 Elsevier Patient Education  2024 [employer].

## 2023-11-19 NOTE — Progress Notes (Signed)
@Patient  ID: Michael Hickman, male    DOB: 13-Dec-1971, 52 y.o.   MRN: [id number]  Chief Complaint  Patient presents with   Follow-up    Referring provider: Jeoffrey Massed, MD  HPI: 52 year old male. PMH significant for OSA, pulmonary nodule, gout, arthritis.  Previous [facility] pulmonary encounter: 11/06/22- Dr. Charleen Kirks 50yM with history of OSA not on CPAP referred for pneumonia A month ago had sore throat, severe HA, fever, cough, wheeze, had CXR concerning for PNA and started on levaquin. A couple days later had red bumpy/itchy rash given steroid injection at [facility]. Rash returned and spread to involve legs/arms. Seen by PCP 8/16 and given another course of z pack and prednisone. Rash is unimproved after steroid course. Hydrocortisone to rash helps him sleep but hasn't really changed extent of it. Covid-19 negative.  Still coughing. Occasionally productive. No fever. CP. Still has rash. He has no DOE.  He says he had some kind of scan recently at an imaging center in [place] but doesn't remember what the finding was (?nodule), whether it was CT or MRI. Can't find any such imaging in [place].   This is first bout of pneumonia that he's ever had.   He has no family history of lung disease  Does have brake dust exposure from cars. He is a Curator - welding fumes, etc. Never smoked. No vaping, MJ.  Interval HPI Small =<17mm nodules on CT Chest.   His cough is much improved. No significant DOE.  Recent bout of of norovirus.    Otherwise pertinent review of systems is negative.  # Pulmonary nodules: Hard to quantify his risk for cancer from his unique exposures, probably worth repeating CT in a year. Also mentions father just found to possibly have IPF.   # OSA not on CPAP  Plan: - CT Chest in a year  - he'll think about whether or not he'd like to pursue sleep study/CPAP again   11/19/2023- Interim hx  Patient presents today for 1 year follow-up/pulmonary nodule. Discussed the  use of AI scribe software for clinical note transcription with the patient, who gave verbal consent to proceed.  History of Present Illness   The patient, followed for pulmonary nodules and sleep apnea, presented for a one-year follow-up. Patient had CT chest on 11/04/23 which showed scattered small bilateral solid pulmonary nodules are all stable from 10/17/2022 and considered benign. No active pulmonary disease. Diffuse hepatic steatosis (fatty liver).   Regarding the sleep apnea, the patient reported no significant issues sleeping recently. He had tried CPAP therapy in the past but discontinued due to lack of adjustments and insurance issues. Despite this, his sleep has been relatively uneventful, with no recent episodes of waking up gasping or choking. He did report snoring. His weight has remained stable since 2019.  The patient had a bout of pneumonia about a year ago, which he described as hitting him hard. He reported no current respiratory symptoms such as shortness of breath or cough. There was a concern about the urgency of the current appointment due to a misunderstanding during scheduling, which caused the patient significant distress.   Allergies  Allergen Reactions   Penicillins    Levaquin [Levofloxacin] Rash    Immunization History  Administered Date(s) Administered   PFIZER(Purple Top)SARS-COV-2 Vaccination 03/02/2020, 03/23/2020   Tdap 09/13/2017    Past Medical History:  Diagnosis Date   Amaurosis fugax of right eye 2020   Transient monocular vision loss--brief.  W/u unremarkable.  Neurologist rec'd 81mg  ASA qd and good control of OSA.   Chronic fatigue    Gout    Hay fever    Hypertriglyceridemia    Lumbar disc disease 02/10/2018   OSA (obstructive sleep apnea)    CPAP in the past but he could not tolerate this; eventually pt gave up on the process.  After 2nd sleep study, he could not afford the CPAP.     Osteoarthritis, multiple sites    knees, back, neck     Tobacco History: Social History   Tobacco Use  Smoking Status Never  Smokeless Tobacco Never   Counseling given: Not Answered   Outpatient Medications Prior to Visit  Medication Sig Dispense Refill   doxycycline (VIBRA-TABS) 100 MG tablet Take 1 tablet (100 mg total) by mouth in the morning and at bedtime. 20 tablet 0   hydrocortisone cream 1 % Apply 1 Application topically 2 (two) times daily.     No facility-administered medications prior to visit.    Review of Systems  Review of Systems  Constitutional: Negative.  Negative for fatigue.  Respiratory: Negative.  Negative for cough and shortness of breath.    Physical Exam  There were no vitals taken for this visit. Physical Exam Constitutional:      General: He is not in acute distress.    Appearance: Normal appearance. He is not ill-appearing.  HENT:     Head: Normocephalic and atraumatic.  Cardiovascular:     Rate and Rhythm: Normal rate and regular rhythm.  Pulmonary:     Effort: Pulmonary effort is normal. No respiratory distress.     Breath sounds: Normal breath sounds. No wheezing, rhonchi or rales.  Neurological:     General: No focal deficit present.     Mental Status: He is alert and oriented to person, place, and time. Mental status is at baseline.  Psychiatric:        Mood and Affect: Mood normal.        Behavior: Behavior normal.        Thought Content: Thought content normal.        Judgment: Judgment normal.      Lab Results:  CBC    Component Value Date/Time   WBC 10.9 (H) 08/08/2020 1548   RBC 5.14 08/08/2020 1548   HGB 15.3 08/08/2020 1548   HCT 43.5 08/08/2020 1548   PLT 269.0 08/08/2020 1548   MCV 84.6 08/08/2020 1548   MCHC 35.1 08/08/2020 1548   RDW 13.4 08/08/2020 1548   LYMPHSABS 2.9 08/08/2020 1548   MONOABS 0.6 08/08/2020 1548   EOSABS 0.1 08/08/2020 1548   BASOSABS 0.1 08/08/2020 1548    BMET    Component Value Date/Time   NA 141 08/08/2020 1548   K 3.8  08/08/2020 1548   CL 106 08/08/2020 1548   CO2 26 08/08/2020 1548   GLUCOSE 96 08/08/2020 1548   BUN 15 08/08/2020 1548   CREATININE 1.27 08/08/2020 1548   CALCIUM 9.3 08/08/2020 1548    BNP No results found for: "BNP"  ProBNP No results found for: "PROBNP"  Imaging: No results found.   Assessment & Plan:   1. Pulmonary nodule  2. OSA (obstructive sleep apnea)  Pulmonary Nodules Stable bilateral pulmonary nodules, largest 0.5 cm in the left lower lobe, unchanged in size over the past year. No new nodules. No active pulmonary disease. -No further action required at this time.  Sleep Apnea Moderate sleep apnea diagnosed in 2019, currently not using CPAP.  Reports no recent episodes of gasping or choking during sleep, but does snore. No significant weight change since 2019. -Encouraged to sleep on side, limit alcohol intake before bed, and avoid driving when tired. -If symptoms of gasping, choking, shortness of breath at night, or excessive daytime sleepiness occur, patient should return for re-evaluation and potential re-initiation of CPAP therapy.   Hepatic steatosis - Follow up with primary care    Glenford Bayley, NP 11/19/2023

## 2024-01-03 ENCOUNTER — Ambulatory Visit (INDEPENDENT_AMBULATORY_CARE_PROVIDER_SITE_OTHER): Payer: 59 | Admitting: Family Medicine

## 2024-01-03 ENCOUNTER — Encounter: Payer: Self-pay | Admitting: Family Medicine

## 2024-01-03 VITALS — BP 124/81 | HR 88 | Wt 228.6 lb

## 2024-01-03 DIAGNOSIS — M7711 Lateral epicondylitis, right elbow: Secondary | ICD-10-CM | POA: Diagnosis not present

## 2024-01-03 DIAGNOSIS — Z125 Encounter for screening for malignant neoplasm of prostate: Secondary | ICD-10-CM | POA: Diagnosis not present

## 2024-01-03 DIAGNOSIS — M10071 Idiopathic gout, right ankle and foot: Secondary | ICD-10-CM | POA: Diagnosis not present

## 2024-01-03 DIAGNOSIS — Z0000 Encounter for general adult medical examination without abnormal findings: Secondary | ICD-10-CM | POA: Diagnosis not present

## 2024-01-03 DIAGNOSIS — R6882 Decreased libido: Secondary | ICD-10-CM

## 2024-01-03 DIAGNOSIS — R5382 Chronic fatigue, unspecified: Secondary | ICD-10-CM

## 2024-01-03 MED ORDER — NITROGLYCERIN 0.4 % RE OINT: TOPICAL_OINTMENT | RECTAL | 0 refills | Status: DC

## 2024-01-03 MED ORDER — COLCHICINE 0.6 MG PO TABS: ORAL_TABLET | ORAL | 1 refills | Status: DC

## 2024-01-03 NOTE — Progress Notes (Signed)
 Office Note 01/17/2024  CC:  Chief Complaint  Patient presents with   Arm Pain    R arm; pain in elbow for the past few weeks. Pt also mentions feeling fatigued often. Requesting bloodwork.     HPI:  Patient is a 53 y.o. male who is here for annual health maintenance exam.  He has a problem with some chronic mild fatigue. He works all the time, wonders that if it is just exhaustion from this. He endorses decreased libido.  He does have a history of obstructive sleep apnea, was never able to tolerate/afford CPAP.  He says he has not had any snoring or witnessed apnea in the last couple of years, though. No abnormal weight loss, no fever or night sweats.   Has had right MTP joint pain for the last couple of weeks. Reports history of gout, most the time in right big toe.  They typically last 1 to 2 days but more recently this 1 has lasted a couple of weeks.  It has improved some with regard to swelling but pain still somewhat problematic.  He used to use colchicine  but does not have any currently. He says that quitting sweet tea has helped some.  Also has had a few weeks of throbbing pain in the right elbow lateral epicondyle. Significant history of repetitive movement of upper extremities. History of right elbow lateral epicondylitis.  I did injection 02/05/2023.  Chronic low back pain still bothering him--> he has appointment with his neurosurgeon Dr. Colon next month.  Past Medical History:  Diagnosis Date   Amaurosis fugax of right eye 2020   Transient monocular vision loss--brief.  W/u unremarkable.  Neurologist rec'd 81mg  ASA qd and good control of OSA.   Chronic fatigue    Gout    Hay fever    Hypertriglyceridemia    Lumbar disc disease 02/10/2018   OSA (obstructive sleep apnea)    CPAP in the past but he could not tolerate this; eventually pt gave up on the process.  After 2nd sleep study, he could not afford the CPAP.     Osteoarthritis, multiple sites    knees,  back, neck    Past Surgical History:  Procedure Laterality Date   APPENDECTOMY  05/24/1999   COLONOSCOPY  ? approx 2017   part of w/u for abd pain per pt's best recollection (Dr. Mann?)-->? polyps?  pt cannot recall any details   EXPLORATORY LAPAROTOMY  12/21/2020   lysis of adhesions   HOLTER MONITOR 48H  12/2018   NORMAL   TRANSTHORACIC ECHOCARDIOGRAM  09/2018   EF 55-60%, NO ABNORMALITIES    Family History  Problem Relation Age of Onset   Pancreatic cancer Maternal Grandmother    Multiple sclerosis Maternal Grandfather    Healthy Mother    Healthy Father    Lung cancer Paternal Grandfather     Social History   Socioeconomic History   Marital status: Married    Spouse name: [name]   Number of children: 1   Years of education: Not on file   Highest education level: Associate degree: occupational, scientist, product/process development, or vocational program  Occupational History   Occupation: Sports Administrator: [employer]  Tobacco Use   Smoking status: Never   Smokeless tobacco: Never  Vaping Use   Vaping status: Never Used  Substance and Sexual Activity   Alcohol use: No    Alcohol/week: 0.0 standard drinks of alcohol   Drug use: No   Sexual activity:  Not on file  Other Topics Concern   Not on file  Social History Narrative   Married to [place], 1 daughter.   Orig from [place].   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   No tob.   No alc.        Patient is right-handed. He lives with his wife in a two level home. He drinks one cup of coffee, and one large glass of tea a day, he does not exercise.   Social Drivers of Corporate Investment Banker Strain: Not on file  Food Insecurity: Unknown (02/01/2022)   Received from [facility], [facility]   Hunger Vital Sign    Worried About Running Out of Food in the Last Year: Patient declined    Ran Out of Food in the Last Year: Patient declined  Transportation Needs: Unknown (02/01/2022)   Received from  [facility], [facility]   [facility] - Transportation    Lack of Transportation (Medical): Patient declined    Lack of Transportation (Non-Medical): Patient declined  Physical Activity: Unknown (02/01/2022)   Received from [facility], [facility]   Exercise Vital Sign    Days of Exercise per Week: Patient declined    Minutes of Exercise per Session: Not on file  Stress: Unknown (02/01/2022)   Received from [facility], [facility] - Occupational Stress Questionnaire    Feeling of Stress : Patient declined  Social Connections: Unknown (05/11/2022)   Received from [facility], [facility]   Social Network    Social Network: Not on file  Intimate Partner Violence: Unknown (04/02/2022)   Received from [facility], [facility]   HITS    Physically Hurt: Not on file    Insult or Talk Down To: Not on file    Threaten Physical Harm: Not on file    Scream or Curse: Not on file    No outpatient medications prior to visit.   No facility-administered medications prior to visit.    Allergies  Allergen Reactions   Penicillins    Levaquin [Levofloxacin] Rash    Review of Systems  Constitutional:  Negative for appetite change, chills, fatigue and fever.  HENT:  Negative for congestion, dental problem, ear pain and sore throat.   Eyes:  Negative for discharge, redness and visual disturbance.  Respiratory:  Negative for cough, chest tightness, shortness of breath and wheezing.   Cardiovascular:  Negative for chest pain, palpitations and leg swelling.  Gastrointestinal:  Negative for abdominal pain, blood in stool, diarrhea, nausea and vomiting.  Genitourinary:  Negative for difficulty urinating, dysuria, flank pain, frequency, hematuria and urgency.  Musculoskeletal:  Positive for arthralgias (R mtp jt). Negative for back pain (chronic LBP), joint swelling, myalgias and neck stiffness.  Skin:  Negative for pallor and rash.   Neurological:  Negative for dizziness, speech difficulty, weakness and headaches.  Hematological:  Negative for adenopathy. Does not bruise/bleed easily.  Psychiatric/Behavioral:  Negative for confusion and sleep disturbance. The patient is not nervous/anxious.     PE;    01/03/2024    2:58 PM 11/19/2023    1:43 PM 02/27/2023   10:59 AM  Vitals with BMI  Height  5' 11   Weight 228 lbs 10 oz 239 lbs 3 oz 233 lbs 3 oz  BMI  33.38 32.54  Systolic 124 134 888  Diastolic 81 82 76  Pulse 88 85 86   Gen: Alert, well appearing.  Patient is oriented to  person, place, time, and situation. AFFECT: pleasant, lucid thought and speech. ENT: Ears: EACs clear, normal epithelium.  TMs with good light reflex and landmarks bilaterally.  Eyes: no injection, icteris, swelling, or exudate.  EOMI, PERRLA. Nose: no drainage or turbinate edema/swelling.  No injection or focal lesion.  Mouth: lips without lesion/swelling.  Oral mucosa pink and moist.  Dentition intact and without obvious caries or gingival swelling.  Oropharynx without erythema, exudate, or swelling.  Neck: supple/nontender.  No LAD, mass, or TM.  Carotid pulses 2+ bilaterally, without bruits. CV: RRR, no m/r/g.   LUNGS: CTA bilat, nonlabored resps, good aeration in all lung fields. ABD: soft, NT, ND, BS normal.  No hepatospenomegaly or mass.  No bruits. EXT: no clubbing, cyanosis, or edema.  Musculoskeletal: Right MTP joint just a little bit tender to palpation, no swelling or erythema.  Slightly impaired flexion and extension due to pain. Otherwise no joint swelling, erythema, warmth, or tenderness.  ROM of all joints intact. Skin - no sores or suspicious lesions or rashes or color changes  Pertinent labs:  None in office today  ASSESSMENT AND PLAN:   Health maintenance exam: Reviewed age and gender appropriate health maintenance issues (prudent diet, regular exercise, health risks of tobacco and excessive alcohol, use of seatbelts,  fire alarms in home, use of sunscreen).  Also reviewed age and gender appropriate health screening as well as vaccine recommendations. Vaccines: Declines Shingrix at this time. Labs: Nonfasting health panel, testosterone , and vitamin B12 level. Prostate ca screening: PSA today. Colon ca screening: Per patient report he had a colonoscopy approximately 2017 as part of the workup for abdominal pain.  He does not recall whether or not he had any polyps.  He does not recall who did the procedure.  Anticipate getting him to gastroenterology for colonoscopy in 2027.  2 recurrent podagra on the right side. Most recent episode slow to resolve.  Colchicine  0.6 mg prescribed to use as needed flare.  #3 right elbow lateral epicondylitis.   Nitroglycerin  0.4% ointment.  Apply pea-sized amount to the lateral right elbow twice daily as needed If not significantly improved in a couple weeks then we will move forward with steroid injection.  #4 chronic fatigue, nonspecific.  No red flags. Check testosterone  vitamin B12, CBC, c-Met, and TSH today. Has history of obstructive sleep apnea but states no snoring or witnessed apnea in quite a while.  An After Visit Summary was printed and given to the patient.  FOLLOW UP: 2 weeks  Signed:  Gerlene Hockey, MD           01/17/2024

## 2024-01-04 LAB — CBC WITH DIFFERENTIAL/PLATELET
Absolute Lymphocytes: 3144 {cells}/uL (ref 850–3900)
Absolute Monocytes: 742 {cells}/uL (ref 200–950)
Basophils Absolute: 70 {cells}/uL (ref 0–200)
Basophils Relative: 0.6 %
Eosinophils Absolute: 267 {cells}/uL (ref 15–500)
Eosinophils Relative: 2.3 %
HCT: 46.7 % (ref 38.5–50.0)
Hemoglobin: 16.1 g/dL (ref 13.2–17.1)
MCH: 29.8 pg (ref 27.0–33.0)
MCHC: 34.5 g/dL (ref 32.0–36.0)
MCV: 86.5 fL (ref 80.0–100.0)
MPV: 11.2 fL (ref 7.5–12.5)
Monocytes Relative: 6.4 %
Neutro Abs: 7378 {cells}/uL (ref 1500–7800)
Neutrophils Relative %: 63.6 %
Platelets: 305 10*3/uL (ref 140–400)
RBC: 5.4 10*6/uL (ref 4.20–5.80)
RDW: 12.9 % (ref 11.0–15.0)
Total Lymphocyte: 27.1 %
WBC: 11.6 10*3/uL — ABNORMAL HIGH (ref 3.8–10.8)

## 2024-01-04 LAB — COMPREHENSIVE METABOLIC PANEL
AG Ratio: 1.6 (calc) (ref 1.0–2.5)
ALT: 26 U/L (ref 9–46)
AST: 19 U/L (ref 10–35)
Albumin: 4.5 g/dL (ref 3.6–5.1)
Alkaline phosphatase (APISO): 66 U/L (ref 35–144)
BUN: 17 mg/dL (ref 7–25)
CO2: 24 mmol/L (ref 20–32)
Calcium: 9.4 mg/dL (ref 8.6–10.3)
Chloride: 107 mmol/L (ref 98–110)
Creat: 1.09 mg/dL (ref 0.70–1.30)
Globulin: 2.9 g/dL (ref 1.9–3.7)
Glucose, Bld: 99 mg/dL (ref 65–99)
Potassium: 3.8 mmol/L (ref 3.5–5.3)
Sodium: 140 mmol/L (ref 135–146)
Total Bilirubin: 0.3 mg/dL (ref 0.2–1.2)
Total Protein: 7.4 g/dL (ref 6.1–8.1)

## 2024-01-04 LAB — TESTOSTERONE: Testosterone: 397 ng/dL (ref 250–827)

## 2024-01-04 LAB — PSA: PSA: 0.99 ng/mL (ref <=4.00)

## 2024-01-04 LAB — TSH: TSH: 2.11 m[IU]/L (ref 0.40–4.50)

## 2024-01-04 LAB — VITAMIN B12: Vitamin B-12: 563 pg/mL (ref 200–1100)

## 2024-01-11 ENCOUNTER — Other Ambulatory Visit: Payer: Self-pay | Admitting: Family Medicine

## 2024-01-17 ENCOUNTER — Encounter: Payer: Self-pay | Admitting: Family Medicine

## 2024-01-17 ENCOUNTER — Ambulatory Visit: Payer: 59 | Admitting: Family Medicine

## 2024-01-17 VITALS — BP 114/77 | HR 89 | Wt 240.0 lb

## 2024-01-17 DIAGNOSIS — M109 Gout, unspecified: Secondary | ICD-10-CM | POA: Diagnosis not present

## 2024-01-17 DIAGNOSIS — Z23 Encounter for immunization: Secondary | ICD-10-CM

## 2024-01-17 DIAGNOSIS — R072 Precordial pain: Secondary | ICD-10-CM

## 2024-01-17 DIAGNOSIS — M7711 Lateral epicondylitis, right elbow: Secondary | ICD-10-CM

## 2024-01-17 NOTE — Progress Notes (Signed)
OFFICE VISIT  01/17/2024  CC:  Chief Complaint  Patient presents with   Arm Pain    F/U elbow pain. Pt states elbow has improved.    Patient is a 53 y.o. male who presents for 2-week recheck right elbow pain. A/P as of last visit: "1 recurrent podagra on the right side. Most recent episode slow to resolve.  Colchicine 0.6 mg prescribed to use as needed flare.   #2 right elbow lateral epicondylitis.   Nitroglycerin 0.4% ointment.  Apply pea-sized amount to the lateral right elbow twice daily as needed If not significantly improved in a couple weeks then we will move forward with steroid injection.   #3 chronic fatigue, nonspecific.  No red flags. Check testosterone vitamin B12, CBC, c-Met, and TSH today. Has history of obstructive sleep apnea but states no snoring or witnessed apnea in quite a while."  INTERIM HX: Right elbow feeling a little bit better.  No longer bothers him when he sleeps at night. He applies the nitroglycerin ointment usually only once a day.  Feet and toes not bothering him any.  This morning he had a very brief twinge of pain at the left parasternal region.  He was laying in bed.  He had no associated nausea, shortness of breath, chest pressure, palpitations, or dizziness. Denies reflux.  No recent fever or cough. He has been working and has not felt any limitation, no chest pain brought on by his usual activities.  Past Medical History:  Diagnosis Date   Amaurosis fugax of right eye 2020   Transient monocular vision loss--brief.  W/u unremarkable.  Neurologist rec'd 81mg  ASA qd and good control of OSA.   Chronic fatigue    Gout    Hay fever    Hypertriglyceridemia    Lumbar disc disease 02/10/2018   OSA (obstructive sleep apnea)    CPAP in the past but he could not tolerate this; eventually pt gave up on the process.  After 2nd sleep study, he could not afford the CPAP.     Osteoarthritis, multiple sites    knees, back, neck   Plantar fasciitis,  left     Past Surgical History:  Procedure Laterality Date   APPENDECTOMY  05/24/1999   COLONOSCOPY  ? approx 2017   part of w/u for abd pain per pt's best recollection (Dr. Mann?)-->? polyps?  pt cannot recall any details   EXPLORATORY LAPAROTOMY  12/21/2020   lysis of adhesions   HOLTER MONITOR 48H  12/2018   NORMAL   TRANSTHORACIC ECHOCARDIOGRAM  09/2018   EF 55-60%, NO ABNORMALITIES    Outpatient Medications Prior to Visit  Medication Sig Dispense Refill   Nitroglycerin 0.4 % OINT Apply dime sized amount to area of pain on right elbow twice a day as needed 30 g 0   colchicine 0.6 MG tablet 2 tabs at onset of gout, then 1 tab 2 hours later, then 1 tab twice a day until flare has resolved (Patient not taking: Reported on 01/17/2024) 30 tablet 1   No facility-administered medications prior to visit.    Allergies  Allergen Reactions   Penicillins    Levaquin [Levofloxacin] Rash    Review of Systems As per HPI  PE:    01/17/2024    3:38 PM 01/03/2024    2:58 PM 11/19/2023    1:43 PM  Vitals with BMI  Height   5\' 11"   Weight 240 lbs 228 lbs 10 oz 239 lbs 3 oz  BMI  33.38  Systolic 114 124 161  Diastolic 77 81 82  Pulse 89 88 85     Physical Exam  Gen: Alert, well appearing.  Patient is oriented to person, place, time, and situation. AFFECT: pleasant, lucid thought and speech. CV: RRR, no m/r/g.   LUNGS: CTA bilat, nonlabored resps, good aeration in all lung fields. No chest wall tenderness.  LABS:  Lab Results  Component Value Date   TSH 2.11 01/03/2024   Lab Results  Component Value Date   WBC 11.6 (H) 01/03/2024   HGB 16.1 01/03/2024   HCT 46.7 01/03/2024   MCV 86.5 01/03/2024   PLT 305 01/03/2024   Lab Results  Component Value Date   CREATININE 1.09 01/03/2024   BUN 17 01/03/2024   NA 140 01/03/2024   K 3.8 01/03/2024   CL 107 01/03/2024   CO2 24 01/03/2024   Lab Results  Component Value Date   ALT 26 01/03/2024   AST 19 01/03/2024    ALKPHOS 60 08/08/2020   BILITOT 0.3 01/03/2024   Lab Results  Component Value Date   CHOL 148 08/15/2020   Lab Results  Component Value Date   HDL 30.50 (L) 08/15/2020   No results found for: "LDLCALC" Lab Results  Component Value Date   TRIG 214.0 (H) 08/15/2020   Lab Results  Component Value Date   CHOLHDL 5 08/15/2020   Lab Results  Component Value Date   PSA 0.99 01/03/2024   PSA 0.73 08/08/2020   PSA 0.92 04/24/2016   Lab Results  Component Value Date   TESTOSTERONE 397 01/03/2024   Lab Results  Component Value Date   VITAMINB12 563 01/03/2024   IMPRESSION AND PLAN:  #1 lateral epicondylitis right elbow. Improving some with nitroglycerin ointment.  He will try to use the ointment twice a day.  He will try to rest it. If symptoms do not continue to improve or if they worsen then he will return for injection.  2.  Podagra. Resolved.   He has colchicine to use as needed.  #3 precordial pain.  Atypical chest pain. Very low suspicion that this is cardiopulmonary etiology. Reassured.  An After Visit Summary was printed and given to the patient.  FOLLOW UP: No follow-ups on file.  Signed:  Santiago Bumpers, MD           01/17/2024

## 2024-04-30 ENCOUNTER — Encounter: Payer: Self-pay | Admitting: Family Medicine

## 2024-04-30 ENCOUNTER — Ambulatory Visit (INDEPENDENT_AMBULATORY_CARE_PROVIDER_SITE_OTHER): Admitting: Family Medicine

## 2024-04-30 VITALS — BP 106/60 | HR 83 | Temp 98.6°F | Ht 71.0 in | Wt 234.6 lb

## 2024-04-30 DIAGNOSIS — K529 Noninfective gastroenteritis and colitis, unspecified: Secondary | ICD-10-CM

## 2024-04-30 DIAGNOSIS — R197 Diarrhea, unspecified: Secondary | ICD-10-CM

## 2024-04-30 LAB — POC COVID19 BINAXNOW: SARS Coronavirus 2 Ag: NEGATIVE

## 2024-04-30 NOTE — Progress Notes (Signed)
 OFFICE VISIT  04/30/2024  CC:  Chief Complaint  Patient presents with   Nausea    X3 days; diarrhea, possible COVID exposure thru work, completed covid test last night (negative). Has not taken any OTC meds    Patient is a 53 y.o. male who presents for nausea and diarrhea.  HPI: 4 days ago he began to have of some mild nausea and then had several episodes of explosive diarrhea. Nausea waxed and waned for the next day but no more diarrhea until yesterday when he had several more episodes of explosive diarrhea.  No blood in the stool, no fever.  He has some mild lower abdominal cramping intermittently.  Some gassiness and bloated feeling.  He did not note significant abdominal distention, though. He has not vomited.  He has eaten a bland diet since the symptoms started.  He has been exposed to several grandchildren who have had recent intestinal virus/similar symptoms. Several people where he works have recently been diagnosed with COVID as well.  He has had some facial pressure that he feels like is from allergies.  He has not had any nasal mucus, sore throat, or cough.  Past Medical History:  Diagnosis Date   Amaurosis fugax of right eye 2020   Transient monocular vision loss--brief.  W/u unremarkable.  Neurologist rec'd 81mg  ASA qd and good control of OSA.   Chronic fatigue    Gout    Hay fever    Hypertriglyceridemia    Lumbar disc disease 02/10/2018   OSA (obstructive sleep apnea)    CPAP in the past but he could not tolerate this; eventually pt gave up on the process.  After 2nd sleep study, he could not afford the CPAP.     Osteoarthritis, multiple sites    knees, back, neck   Plantar fasciitis, left     Past Surgical History:  Procedure Laterality Date   APPENDECTOMY  05/24/1999   COLONOSCOPY  ? approx 2017   part of w/u for abd pain per pt's best recollection (Dr. Mann?)-->? polyps?  pt cannot recall any details   EXPLORATORY LAPAROTOMY  12/21/2020   lysis of  adhesions   HOLTER MONITOR 48H  12/2018   NORMAL   TRANSTHORACIC ECHOCARDIOGRAM  09/2018   EF 55-60%, NO ABNORMALITIES    Outpatient Medications Prior to Visit  Medication Sig Dispense Refill   colchicine  0.6 MG tablet 2 tabs at onset of gout, then 1 tab 2 hours later, then 1 tab twice a day until flare has resolved (Patient not taking: Reported on 04/30/2024) 30 tablet 1   Nitroglycerin  0.4 % OINT Apply dime sized amount to area of pain on right elbow twice a day as needed (Patient not taking: Reported on 04/30/2024) 30 g 0   No facility-administered medications prior to visit.    Allergies  Allergen Reactions   Penicillins    Levaquin [Levofloxacin] Rash    Review of Systems  As per HPI  PE:    04/30/2024    9:17 AM 01/17/2024    3:38 PM 01/03/2024    2:58 PM  Vitals with BMI  Height 5\' 11"     Weight 234 lbs 10 oz 240 lbs 228 lbs 10 oz  BMI 32.73    Systolic 106 114 621  Diastolic 60 77 81  Pulse 83 89 88     Physical Exam  Gen: Alert, well appearing.  Patient is oriented to person, place, time, and situation. AFFECT: pleasant, lucid thought and speech.  CV:  RRR, no m/r/g.   LUNGS: CTA bilat, nonlabored resps, good aeration in all lung fields. Abdomen is soft and nondistended.  He has mild discomfort to palpation diffusely across the lower abdomen.  His bowel sounds are decreased.  There is no high-pitched tinkling/rushes.  LABS:  Last CBC Lab Results  Component Value Date   WBC 11.6 (H) 01/03/2024   HGB 16.1 01/03/2024   HCT 46.7 01/03/2024   MCV 86.5 01/03/2024   MCH 29.8 01/03/2024   RDW 12.9 01/03/2024   PLT 305 01/03/2024   Last metabolic panel Lab Results  Component Value Date   GLUCOSE 99 01/03/2024   NA 140 01/03/2024   K 3.8 01/03/2024   CL 107 01/03/2024   CO2 24 01/03/2024   BUN 17 01/03/2024   CREATININE 1.09 01/03/2024   GFR 59.83 (L) 08/08/2020   CALCIUM 9.4 01/03/2024   PROT 7.4 01/03/2024   ALBUMIN 4.3 08/08/2020   BILITOT 0.3  01/03/2024   ALKPHOS 60 08/08/2020   AST 19 01/03/2024   ALT 26 01/03/2024   Last hemoglobin A1c Lab Results  Component Value Date   HGBA1C 5.3 08/08/2020   IMPRESSION AND PLAN:  Acute gastroenteritis, suspect viral etiology. Continue to hydrate well and eat bland diet, advance as tolerated. Avoid any antidiarrhea medication. He declined any nausea medication today.  He does have a past history of small bowel obstruction due to adhesions but he currently does not have a clinical picture worrisome for this diagnosis. Signs/symptoms to call or return for were reviewed and pt expressed understanding.  COVID test negative here today.  An After Visit Summary was printed and given to the patient.  FOLLOW UP: Return if symptoms worsen or fail to improve. Next CPE 12/2024 Signed:  Arletha Lady, MD           04/30/2024

## 2024-05-14 ENCOUNTER — Telehealth: Admitting: Urgent Care

## 2024-05-14 ENCOUNTER — Encounter: Payer: Self-pay | Admitting: Urgent Care

## 2024-05-14 DIAGNOSIS — R0982 Postnasal drip: Secondary | ICD-10-CM

## 2024-05-14 DIAGNOSIS — R0981 Nasal congestion: Secondary | ICD-10-CM

## 2024-05-14 DIAGNOSIS — Z8701 Personal history of pneumonia (recurrent): Secondary | ICD-10-CM

## 2024-05-14 MED ORDER — PREDNISONE 50 MG PO TABS: 50.0000 mg | ORAL_TABLET | Freq: Every day | ORAL | 0 refills | Status: DC

## 2024-05-14 MED ORDER — AZITHROMYCIN 250 MG PO TABS: ORAL_TABLET | ORAL | 0 refills | Status: AC

## 2024-05-14 NOTE — Progress Notes (Signed)
 Virtual Visit via Video Note  I connected with Michael Hickman on 05/14/24 at 10:00 AM EDT by a video enabled telemedicine application and verified that I am speaking with the correct person using two identifiers.  Patient Location: Home Provider Location: Office/Clinic  I discussed the limitations, risks, security, and privacy concerns of performing an evaluation and management service by video and the availability of in person appointments. I also discussed with the patient that there may be a patient responsible charge related to this service. The patient expressed understanding and agreed to proceed.   Subjective  PCP: Shelvia Dick, MD  Chief Complaint  Patient presents with   Cough    Onset 05/12 congestion, denies any body aches    HPI:   53 year old male who presents with sinus congestion and throat pain.  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 Initially, he experienced burning in his sinuses, which progressed to a runny nose, congestion, and a sore throat. By last night, he was coughing up mucus.  The congestion is widespread, affecting his cheeks, forehead, behind the eyes, and nose, with drainage down the back of his throat. His throat pain was particularly severe last night, and he feels the symptoms are moving into his chest. No tightness or wheezing, but his voice is not normal and is breaking up and hoarse.  He has a history of allergies to dust and mold but does not take any medication for these allergies. He has not used any over-the-counter medications for his current symptoms, except for cough drops and immune vitamins. He is concerned about developing pneumonia, as he had pneumonia one to two years ago and had a difficult time with it. He denies any history of COPD or asthma and is not currently on any prescription medications. He is allergic to penicillin and Levaquin.       ROS: Per HPI. All other  pertinent systems are negative.   Current Outpatient Medications:    azithromycin  (ZITHROMAX ) 250 MG tablet, Take 2 tablets on day 1, then 1 tablet daily on days 2 through 5, Disp: 6 tablet, Rfl: 0   predniSONE  (DELTASONE ) 50 MG tablet, Take 1 tablet (50 mg total) by mouth daily with breakfast., Disp: 5 tablet, Rfl: 0    Objective  Vital signs not able to be obtained due to this being a virtual visit.  Physical Exam Nursing note reviewed.  Constitutional:      General: He is not in acute distress.    Appearance: Normal appearance. He is not ill-appearing, toxic-appearing or diaphoretic.  HENT:     Mouth/Throat:     Comments: Hoarse, nasally congested voice Pulmonary:     Effort: Pulmonary effort is normal. No respiratory distress.     Comments: Minimal dry cough during encounter Skin:    General: Skin is warm.     Findings: No erythema.  Neurological:     General: No focal deficit present.     Mental Status: He is alert and oriented to person, place, and time.   Exam limited due to video visit capabilities    Assessment & Plan Congestion of nasal sinus  1. Congestion of nasal sinus - predniSONE  (DELTASONE ) 50 MG tablet; Take 1 tablet (50 mg total) by mouth daily with breakfast. - azithromycin  (ZITHROMAX ) 250 MG tablet; Take 2 tablets on day 1, then 1 tablet daily on days 2 through 5  2. Post-nasal drainage - predniSONE  (DELTASONE ) 50  MG tablet; Take 1 tablet (50 mg total) by mouth daily with breakfast. - azithromycin  (ZITHROMAX ) 250 MG tablet; Take 2 tablets on day 1, then 1 tablet daily on days 2 through 5  3. History of pneumonia - predniSONE  (DELTASONE ) 50 MG tablet; Take 1 tablet (50 mg total) by mouth daily with breakfast. - azithromycin  (ZITHROMAX ) 250 MG tablet; Take 2 tablets on day 1, then 1 tablet daily on days 2 through 5  Post-nasal drainage  1. Congestion of nasal sinus - predniSONE  (DELTASONE ) 50 MG tablet; Take 1 tablet (50 mg total) by mouth daily with  breakfast. - azithromycin  (ZITHROMAX ) 250 MG tablet; Take 2 tablets on day 1, then 1 tablet daily on days 2 through 5  2. Post-nasal drainage - predniSONE  (DELTASONE ) 50 MG tablet; Take 1 tablet (50 mg total) by mouth daily with breakfast. - azithromycin  (ZITHROMAX ) 250 MG tablet; Take 2 tablets on day 1, then 1 tablet daily on days 2 through 5  3. History of pneumonia - predniSONE  (DELTASONE ) 50 MG tablet; Take 1 tablet (50 mg total) by mouth daily with breakfast. - azithromycin  (ZITHROMAX ) 250 MG tablet; Take 2 tablets on day 1, then 1 tablet daily on days 2 through 5  History of pneumonia  1. Congestion of nasal sinus - predniSONE  (DELTASONE ) 50 MG tablet; Take 1 tablet (50 mg total) by mouth daily with breakfast. - azithromycin  (ZITHROMAX ) 250 MG tablet; Take 2 tablets on day 1, then 1 tablet daily on days 2 through 5  2. Post-nasal drainage - predniSONE  (DELTASONE ) 50 MG tablet; Take 1 tablet (50 mg total) by mouth daily with breakfast. - azithromycin  (ZITHROMAX ) 250 MG tablet; Take 2 tablets on day 1, then 1 tablet daily on days 2 through 5  3. History of pneumonia - predniSONE  (DELTASONE ) 50 MG tablet; Take 1 tablet (50 mg total) by mouth daily with breakfast. - azithromycin  (ZITHROMAX ) 250 MG tablet; Take 2 tablets on day 1, then 1 tablet daily on days 2 through 5  Assessment and Plan    Acute sinusitis Acute sinusitis likely due to sawdust exposure. Possibly developing into bacterial sinusitis and pt at risk of pneumonia. Discussed azithromycin  and prednisone  for inflammation and infection. Emphasized nasal lavage to prevent complications. - Prescribe azithromycin  for five days. - Prescribe prednisone  for five days. - Recommend saline nasal lavage. - Advise use of plain Mucinex. - Encourage hydration and steam inhalation. - Provide work absence note if needed.  Hx of Pneumonia Concern for recurrence due to respiratory symptoms. Advised against cough suppressants to  ensure mucus expulsion. - Monitor for signs of pneumonia recurrence. - Avoid cough suppressants but use mucinex BID.       No follow-ups on file.   I discussed the assessment and treatment plan with the patient. The patient was provided an opportunity to ask questions, and all were answered. The patient agreed with the plan and demonstrated an understanding of the instructions.   The patient was advised to call back or seek an in-person evaluation if the symptoms worsen or if the condition fails to improve as anticipated.  The above assessment and management plan was discussed with the patient. The patient verbalized understanding of and has agreed to the management plan.   Mandy Second, PA

## 2024-05-14 NOTE — Patient Instructions (Addendum)
  Use either of these products and perform a sinus rinse/ lavage to help remove any irritants or particulates causing your symptoms. Flush the nasal passages several times daily.  Start the azithromycin  per package directions. Take prednisone  one daily, preferably in the morning with breakfast.  Use steam from a hot shower to help open up the nasal passages. Use plain mucinex (without a decongestant) available over the counter twice daily with plenty of water.

## 2024-05-20 ENCOUNTER — Telehealth: Admitting: Nurse Practitioner

## 2024-05-20 ENCOUNTER — Ambulatory Visit: Payer: Self-pay

## 2024-05-20 DIAGNOSIS — J0140 Acute pansinusitis, unspecified: Secondary | ICD-10-CM

## 2024-05-20 MED ORDER — DOXYCYCLINE HYCLATE 100 MG PO TABS: 100.0000 mg | ORAL_TABLET | Freq: Two times a day (BID) | ORAL | 0 refills | Status: AC

## 2024-05-20 MED ORDER — CETIRIZINE HCL 10 MG PO TABS: 10.0000 mg | ORAL_TABLET | Freq: Every day | ORAL | 1 refills | Status: DC

## 2024-05-20 MED ORDER — FLUTICASONE PROPIONATE 50 MCG/ACT NA SUSP: 2.0000 | Freq: Every day | NASAL | 6 refills | Status: DC

## 2024-05-20 NOTE — Progress Notes (Signed)
 Virtual Visit Consent   Michael Hickman, you are scheduled for a virtual visit with a [facility] provider today. Just as with appointments in the office, your consent must be obtained to participate. Your consent will be active for this visit and any virtual visit you may have with one of our providers in the next 365 days. If you have a MyChart account, a copy of this consent can be sent to you electronically.  As this is a virtual visit, video technology does not allow for your provider to perform a traditional examination. This may limit your provider's ability to fully assess your condition. If your provider identifies any concerns that need to be evaluated in person or the need to arrange testing (such as labs, EKG, etc.), we will make arrangements to do so. Although advances in technology are sophisticated, we cannot ensure that it will always work on either your end or our end. If the connection with a video visit is poor, the visit may have to be switched to a telephone visit. With either a video or telephone visit, we are not always able to ensure that we have a secure connection.  By engaging in this virtual visit, you consent to the provision of healthcare and authorize for your insurance to be billed (if applicable) for the services provided during this visit. Depending on your insurance coverage, you may receive a charge related to this service.  I need to obtain your verbal consent now. Are you willing to proceed with your visit today? JAIVON Michael Hickman has provided verbal consent on 05/20/2024 for a virtual visit (video or telephone). Mardene Shake, FNP  Date: 05/20/2024 6:39 PM   Virtual Visit via Video Note   I, Mardene Shake, connected with  Michael Hickman  ([id number], 17-Apr-1971) on 05/20/24 at  6:45 PM EDT by a video-enabled telemedicine application and verified that I am speaking with the correct person using two identifiers.  Location: Patient: Virtual Visit Location Patient:  Home Provider: Virtual Visit Location Provider: Home Office   I discussed the limitations of evaluation and management by telemedicine and the availability of in person appointments. The patient expressed understanding and agreed to proceed.    History of Present Illness: PLUMER Michael Hickman is a 53 y.o. who identifies as a male who was assigned male at birth, and is being seen today for recurrent symptoms   He was treated on 05/14/24 for lower respiratory symptoms  Treated with Azithromycin /prednisone  at that time. Regimen finished 05/18/24.  He started to feel a return of symptoms this morning. Nasal congestion, PND and a slight cough that might be productive at times.   Denies a history of asthma but has had pneumonia in the past    Symptom onset originally was after allergen exposure  He will be traveling out of town and is concerned his infection has not cleared   Problems:  Patient Active Problem List   Diagnosis Date Noted   Transient vision disturbance 10/15/2018   Lumbar disc disease 02/10/2018   Palpitations 02/10/2018   OSA (obstructive sleep apnea) 02/10/2018   Acute gout 04/24/2016   Encounter for preventative adult health care exam with abnormal findings 04/24/2016   Arthritis    Hay fever    Hypertriglyceridemia    Gout    PULMONARY NODULE, RIGHT MIDDLE LOBE 02/08/2011    Allergies:  Allergies  Allergen Reactions   Penicillins    Levaquin [Levofloxacin] Rash   Medications:  Current Outpatient Medications:  predniSONE  (DELTASONE ) 50 MG tablet, Take 1 tablet (50 mg total) by mouth daily with breakfast., Disp: 5 tablet, Rfl: 0  Observations/Objective: Patient is well-developed, well-nourished in no acute distress.  Resting comfortably  at home.  Head is normocephalic, atraumatic.  No labored breathing.  Speech is clear and coherent with logical content.  Patient is alert and oriented at baseline.    Assessment and Plan:  1. Acute non-recurrent  pansinusitis Advised 24-48 hour trial if allergy regimen prior to starting antibiotics. If symptoms are improving hold doxycyline and continue allergy regimen for 1-2 weeks.    Meds ordered this encounter  Medications   fluticasone (FLONASE) 50 MCG/ACT nasal spray    Sig: Place 2 sprays into both nostrils daily.    Dispense:  16 g    Refill:  6   cetirizine (ZYRTEC ALLERGY) 10 MG tablet    Sig: Take 1 tablet (10 mg total) by mouth daily.    Dispense:  90 tablet    Refill:  1   doxycycline  (VIBRA -TABS) 100 MG tablet    Sig: Take 1 tablet (100 mg total) by mouth 2 (two) times daily for 7 days.    Dispense:  14 tablet    Refill:  0    Follow Up Instructions: I discussed the assessment and treatment plan with the patient. The patient was provided an opportunity to ask questions and all were answered. The patient agreed with the plan and demonstrated an understanding of the instructions.  A copy of instructions were sent to the patient via MyChart unless otherwise noted below.     The patient was advised to call back or seek an in-person evaluation if the symptoms worsen or if the condition fails to improve as anticipated.    Mardene Shake, FNP

## 2024-05-20 NOTE — Telephone Encounter (Signed)
 Chief Complaint: sore throat, sinus Symptoms: sinus burning in nose, sore throat Frequency: 05/14/2024 and worsening Pertinent Negatives: Patient denies fever or body aches Disposition: [] ED /[] Urgent Care (no appt availability in office) / [] Appointment(In office/virtual)/ [x]  [facility]/ [] Home Care/ [] Refused Recommended Disposition /[] [facility]/ []  Follow-up with PCP Additional Notes: pt initially become sick & had video visit on 05/14/2024: took medication and started to feel better.  Today pt stated he woke up and everything is restarting. Scheduled video visit for today for further evaluation/  Reason for Disposition  [1] Sinus congestion (pressure, fullness) AND [2] present > 10 days  [1] Sore throat with cough/cold symptoms AND [2] present > 5 days  Answer Assessment - Initial Assessment Questions 1. ONSET: "When did the throat start hurting?" (Hours or days ago)      today 2. SEVERITY: "How bad is the sore throat?" (Scale 1-10; mild, moderate or severe)   - MILD (1-3):  Doesn't interfere with eating or normal activities.   - MODERATE (4-7): Interferes with eating some solids and normal activities.   - SEVERE (8-10):  Excruciating pain, interferes with most normal activities.   - SEVERE WITH DYSPHAGIA (10): Can't swallow liquids, drooling.     Mild to moderate 3. STREP EXPOSURE: "Has there been any exposure to strep within the past week?" If Yes, ask: "What type of contact occurred?"      N/a 4.  VIRAL SYMPTOMS: "Are there any symptoms of a cold, such as a runny nose, cough, hoarse voice or red eyes?"      Cough, sinus issues 5. FEVER: "Do you have a fever?" If Yes, ask: "What is your temperature, how was it measured, and when did it start?"     no 6. PUS ON THE TONSILS: "Is there pus on the tonsils in the back of your throat?"     N/a 7. OTHER SYMPTOMS: "Do you have any other symptoms?" (e.g., difficulty breathing, headache, rash)     Sinus  burning 8. PREGNANCY: "Is there any chance you are pregnant?" "When was your last menstrual period?"     N/a  Answer Assessment - Initial Assessment Questions 1. LOCATION: "Where does it hurt?"      nose 2. ONSET: "When did the sinus pain start?"  (e.g., hours, days)      05/14/2024 3. SEVERITY: "How bad is the pain?"   (Scale 1-10; mild, moderate or severe)   - MILD (1-3): doesn't interfere with normal activities    - MODERATE (4-7): interferes with normal activities (e.g., work or school) or awakens from sleep   - SEVERE (8-10): excruciating pain and patient unable to do any normal activities        Mild to moderate 4. RECURRENT SYMPTOM: "Have you ever had sinus problems before?" If Yes, ask: "When was the last time?" and "What happened that time?"      yes 5. NASAL CONGESTION: "Is the nose blocked?" If Yes, ask: "Can you open it or must you breathe through your mouth?"     no 6. NASAL DISCHARGE: "Do you have discharge from your nose?" If so ask, "What color?"     no 7. FEVER: "Do you have a fever?" If Yes, ask: "What is it, how was it measured, and when did it start?"      no 8. OTHER SYMPTOMS: "Do you have any other symptoms?" (e.g., sore throat, cough, earache, difficulty breathing)     Cough, sore throat 9. PREGNANCY: "Is  there any chance you are pregnant?" "When was your last menstrual period?"     N/a  Protocols used: Sore Throat-A-AH, Sinus Pain or Congestion-A-AH

## 2024-05-20 NOTE — Telephone Encounter (Signed)
 Copied From CRM [id number]. Reason for Triage: sore throat,sinus burning,feeling ran down after being seen last week and complete 5 days prescription   05/20/24 1400: 1st attempt, no answer, LVMTCB [phone]

## 2024-09-10 ENCOUNTER — Telehealth: Payer: Self-pay | Admitting: Family Medicine

## 2024-09-10 NOTE — Telephone Encounter (Unsigned)
 Copied from CRM [id number]. Topic: Clinical - Medication Refill >> Sep 10, 2024  4:02 PM Thersia C wrote: Medication: Nitroglycerin  0.4 % OINT  Has the patient contacted their pharmacy? Yes (Agent: If no, request that the patient contact the pharmacy for the refill. If patient does not wish to contact the pharmacy document the reason why and proceed with request.) (Agent: If yes, when and what did the pharmacy advise?)  This is the patient's preferred pharmacy:  [id number] - [place], [place] - [address] [place] [address] [place] [address] [place] [address] Phone: [phone] Fax: [phone]  Is this the correct pharmacy for this prescription? Yes If no, delete pharmacy and type the correct one.   Has the prescription been filled recently? No  Is the patient out of the medication? Yes  Has the patient been seen for an appointment in the last year OR does the patient have an upcoming appointment? Yes  Can we respond through MyChart? Yes  Agent: Please be advised that Rx refills may take up to 3 business days. We ask that you follow-up with your pharmacy.

## 2024-09-11 MED ORDER — NITROGLYCERIN 0.4 % RE OINT: TOPICAL_OINTMENT | RECTAL | 0 refills | Status: AC

## 2024-09-30 ENCOUNTER — Ambulatory Visit: Payer: Self-pay

## 2024-09-30 ENCOUNTER — Telehealth: Admitting: Physician Assistant

## 2024-09-30 DIAGNOSIS — J019 Acute sinusitis, unspecified: Secondary | ICD-10-CM | POA: Diagnosis not present

## 2024-09-30 DIAGNOSIS — B9789 Other viral agents as the cause of diseases classified elsewhere: Secondary | ICD-10-CM

## 2024-09-30 MED ORDER — AZITHROMYCIN 250 MG PO TABS: ORAL_TABLET | ORAL | 0 refills | Status: AC

## 2024-09-30 MED ORDER — PREDNISONE 10 MG (21) PO TBPK: ORAL_TABLET | ORAL | 0 refills | Status: AC

## 2024-09-30 NOTE — Patient Instructions (Signed)
 Toribio KATHEE Maiden, thank you for joining [name], PA-C for today's virtual visit.  While this provider is not your primary care provider (PCP), if your PCP is located in our provider database this encounter information will be shared with them immediately following your visit.   A [facility] MyChart account gives you access to today's visit and all your visits, tests, and labs performed at [facility]  click here if you don't have a [facility] MyChart account or go to [email or web address]  Consent: (Patient) Toribio KATHEE Maiden provided verbal consent for this virtual visit at the beginning of the encounter.  Current Medications:  Current Outpatient Medications:    Nitroglycerin  0.4 % OINT, Apply dime sized amount to area of pain on right elbow twice a day as needed, Disp: 30 g, Rfl: 0   Medications ordered in this encounter:  No orders of the defined types were placed in this encounter.    *If you need refills on other medications prior to your next appointment, please contact your pharmacy*  Follow-Up: Call back or seek an in-person evaluation if the symptoms worsen or if the condition fails to improve as anticipated.  [facility] [phone]  Other Instructions Sinus Infection, Adult A sinus infection is soreness and swelling (inflammation) of your sinuses. Sinuses are hollow spaces in the bones around your face. They are located: Around your eyes. In the middle of your forehead. Behind your nose. In your cheekbones. Your sinuses and nasal passages are lined with a fluid called mucus. Mucus drains out of your sinuses. Swelling can trap mucus in your sinuses. This lets germs (bacteria, virus, or fungus) grow, which leads to infection. Most of the time, this condition is caused by a virus. What are the causes? Allergies. Asthma. Germs. Things that block your nose or sinuses. Growths in the nose (nasal polyps). Chemicals or irritants  in the air. A fungus. This is rare. What increases the risk? Having a weak body defense system (immune system). Doing a lot of swimming or diving. Using nasal sprays too much. Smoking. What are the signs or symptoms? The main symptoms of this condition are pain and a feeling of pressure around the sinuses. Other symptoms include: Stuffy nose (congestion). This may make it hard to breathe through your nose. Runny nose (drainage). Soreness, swelling, and warmth in the sinuses. A cough that may get worse at night. Being unable to smell and taste. Mucus that collects in the throat or the back of the nose (postnasal drip). This may cause a sore throat or bad breath. Being very tired (fatigued). A fever. How is this diagnosed? Your symptoms. Your medical history. A physical exam. Tests to find out if your condition is short-term (acute) or long-term (chronic). Your doctor may: Check your nose for growths (polyps). Check your sinuses using a tool that has a light on one end (endoscope). Check for allergies or germs. Do imaging tests, such as an MRI or CT scan. How is this treated? Treatment for this condition depends on the cause and whether it is short-term or long-term. If caused by a virus, your symptoms should go away on their own within 10 days. You may be given medicines to relieve symptoms. They include: Medicines that shrink swollen tissue in the nose. A spray that treats swelling of the nostrils. Rinses that help get rid of thick mucus in your nose (nasal saline washes). Medicines that treat allergies (antihistamines). Over-the-counter pain relievers. If caused by bacteria, your  doctor may wait to see if you will get better without treatment. You may be given antibiotic medicine if you have: A very bad infection. A weak body defense system. If caused by growths in the nose, surgery may be needed. Follow these instructions at home: Medicines Take, use, or apply  over-the-counter and prescription medicines only as told by your doctor. These may include nasal sprays. If you were prescribed an antibiotic medicine, take it as told by your doctor. Do not stop taking it even if you start to feel better. Hydrate and humidify  Drink enough water to keep your pee (urine) pale yellow. Use a cool mist humidifier to keep the humidity level in your home above 50%. Breathe in steam for 10-15 minutes, 3-4 times a day, or as told by your doctor. You can do this in the bathroom while a hot shower is running. Try not to spend time in cool or dry air. Rest Rest as much as you can. Sleep with your head raised (elevated). Make sure you get enough sleep each night. General instructions  Put a warm, moist washcloth on your face 3-4 times a day, or as often as told by your doctor. Use nasal saline washes as often as told by your doctor. Wash your hands often with soap and water. If you cannot use soap and water, use hand sanitizer. Do not smoke. Avoid being around people who are smoking (secondhand smoke). Keep all follow-up visits. Contact a doctor if: You have a fever. Your symptoms get worse. Your symptoms do not get better within 10 days. Get help right away if: You have a very bad headache. You cannot stop vomiting. You have very bad pain or swelling around your face or eyes. You have trouble seeing. You feel confused. Your neck is stiff. You have trouble breathing. These symptoms may be an emergency. Get help right away. Call 911. Do not wait to see if the symptoms will go away. Do not drive yourself to the hospital. Summary A sinus infection is swelling of your sinuses. Sinuses are hollow spaces in the bones around your face. This condition is caused by tissues in your nose that become inflamed or swollen. This traps germs. These can lead to infection. If you were prescribed an antibiotic medicine, take it as told by your doctor. Do not stop taking it  even if you start to feel better. Keep all follow-up visits. This information is not intended to replace advice given to you by your health care provider. Make sure you discuss any questions you have with your health care provider. Document Revised: 11/21/2021 Document Reviewed: 11/21/2021 Elsevier Patient Education  2024 [employer].   If you have been instructed to have an in-person evaluation today at a local Urgent Care facility, please use the link below. It will take you to a list of all of our available [facility], including address, phone number and hours of operation. Please do not delay care.  [facility]  If you or a family member do not have a primary care provider, use the link below to schedule a visit and establish care. When you choose a [facility] primary care physician or advanced practice provider, you gain a long-term partner in health. Find a Primary Care Provider  Learn more about [facility]'s in-office and virtual care options: [facility] - Get Care Now

## 2024-09-30 NOTE — Telephone Encounter (Signed)
 FYI Only or Action Required?: FYI only for provider.  Patient was last seen in primary care on 05/20/2024 by Kennyth Domino, FNP.  Called Nurse Triage reporting Sinusitis.  Symptoms began yesterday.  Symptoms are: gradually worsening.  Triage Disposition: See PCP When Office is Open (Within 3 Days)  Patient/caregiver understands and will follow disposition?: Yes     Copied from CRM #[id number]. Topic: Clinical - Red Word Triage >> Sep 30, 2024  3:47 PM [facility] ORN wrote: Red Word that prompted transfer to Nurse Triage: Possible sinus infection. Sx of burning sinuses, nasal drainage, fatigue and some coughing began 1.5 days ago.     Reason for Disposition  Lots of coughing  Answer Assessment - Initial Assessment Questions 1. LOCATION: Where does it hurt?      Around nose 2. ONSET: When did the sinus pain start?  (e.g., hours, days)      1 day ago  3. SEVERITY: How bad is the pain?   (Scale 0-10; or none, mild, moderate or severe)     5/10 4. RECURRENT SYMPTOM: Have you ever had sinus problems before? If Yes, ask: When was the last time? and What happened that time?      Yes, history of sinus infection  5. NASAL CONGESTION: Is the nose blocked? If Yes, ask: Can you open it or must you breathe through your mouth?     Some  6. NASAL DISCHARGE: Do you have discharge from your nose? If so ask, What color?     Minor  7. FEVER: Do you have a fever? If Yes, ask: What is it, how was it measured, and when did it start?      No 8. OTHER SYMPTOMS: Do you have any other symptoms? (e.g., sore throat, cough, earache, difficulty breathing)     Sore throat, cough  Protocols used: Sinus Pain or Congestion-A-AH

## 2024-09-30 NOTE — Progress Notes (Signed)
 Virtual Visit Consent   Michael Hickman, you are scheduled for a virtual visit with a [facility] provider today. Just as with appointments in the office, your consent must be obtained to participate. Your consent will be active for this visit and any virtual visit you may have with one of our providers in the next 365 days. If you have a MyChart account, a copy of this consent can be sent to you electronically.  As this is a virtual visit, video technology does not allow for your provider to perform a traditional examination. This may limit your provider's ability to fully assess your condition. If your provider identifies any concerns that need to be evaluated in person or the need to arrange testing (such as labs, EKG, etc.), we will make arrangements to do so. Although advances in technology are sophisticated, we cannot ensure that it will always work on either your end or our end. If the connection with a video visit is poor, the visit may have to be switched to a telephone visit. With either a video or telephone visit, we are not always able to ensure that we have a secure connection.  By engaging in this virtual visit, you consent to the provision of healthcare and authorize for your insurance to be billed (if applicable) for the services provided during this visit. Depending on your insurance coverage, you may receive a charge related to this service.  I need to obtain your verbal consent now. Are you willing to proceed with your visit today? [name] has provided verbal consent on 09/30/2024 for a virtual visit (video or telephone). Michael Hickman, [place]  Date: 09/30/2024 4:31 PM   Virtual Visit via Video Note   I, Michael Hickman, connected with  MARCIANO MUNDT  ([id number], 1971/03/03) on 09/30/24 at  4:30 PM EDT by a video-enabled telemedicine application and verified that I am speaking with the correct person using two identifiers.  Location: Patient: Virtual Visit  Location Patient: Home Provider: Virtual Visit Location Provider: Home Office   I discussed the limitations of evaluation and management by telemedicine and the availability of in person appointments. The patient expressed understanding and agreed to proceed.    History of Present Illness: Michael Hickman is a 53 y.o. who identifies as a male who was assigned male at birth, and is being seen today for possible sinusitis. Endorses symptoms over the past 2 days with sinus pressure, sinus burning associated with increased post-nasal drip. Some sinus pain. Denies fever, chills.   OTC -- Vitamin  HPI: HPI  Problems:  Patient Active Problem List   Diagnosis Date Noted   Transient vision disturbance 10/15/2018   Lumbar disc disease 02/10/2018   Palpitations 02/10/2018   OSA (obstructive sleep apnea) 02/10/2018   Acute gout 04/24/2016   Encounter for preventative adult health care exam with abnormal findings 04/24/2016   Arthritis    Hay fever    Hypertriglyceridemia    Gout    PULMONARY NODULE, RIGHT MIDDLE LOBE 02/08/2011    Allergies:  Allergies  Allergen Reactions   Penicillins    Levaquin [Levofloxacin] Rash   Medications:  Current Outpatient Medications:    azithromycin  (ZITHROMAX ) 250 MG tablet, Take 2 tablets on day 1, then 1 tablet daily on days 2 through 5, Disp: 6 tablet, Rfl: 0   predniSONE  (STERAPRED UNI-PAK 21 TAB) 10 MG (21) TBPK tablet, Take following package directions, Disp: 21 tablet, Rfl: 0   Nitroglycerin  0.4 % OINT,  Apply dime sized amount to area of pain on right elbow twice a day as needed, Disp: 30 g, Rfl: 0  Observations/Objective: Patient is well-developed, well-nourished in no acute distress.  Resting comfortably  at home.  Head is normocephalic, atraumatic.  No labored breathing.  Speech is clear and coherent with logical content.  Patient is alert and oriented at baseline.   Assessment and Plan: 1. Acute viral sinusitis (Primary) - predniSONE   (STERAPRED UNI-PAK 21 TAB) 10 MG (21) TBPK tablet; Take following package directions  Dispense: 21 tablet; Refill: 0  Discussed this seems most likely viral. Supportive measures and OTC medications reviewed. Will have him restart Flonase . Will start Sterapred pack giving history of sinusitis. He is worried about needing an antibiotic as he is leaving town next week. Placed script for ABX on file in case symptoms are not resolving with initial treatment.  Follow Up Instructions: I discussed the assessment and treatment plan with the patient. The patient was provided an opportunity to ask questions and all were answered. The patient agreed with the plan and demonstrated an understanding of the instructions.  A copy of instructions were sent to the patient via MyChart unless otherwise noted below.   The patient was advised to call back or seek an in-person evaluation if the symptoms worsen or if the condition fails to improve as anticipated.    Michael Velma Lunger, PA-C

## 2024-11-14 ENCOUNTER — Other Ambulatory Visit: Payer: Self-pay | Admitting: Nurse Practitioner

## 2024-11-14 DIAGNOSIS — J0140 Acute pansinusitis, unspecified: Secondary | ICD-10-CM

## 2025-01-22 ENCOUNTER — Other Ambulatory Visit: Payer: Self-pay

## 2025-01-22 MED ORDER — COLCHICINE 0.6 MG PO TABS: ORAL_TABLET | ORAL | 0 refills | Status: AC

## 2025-01-22 NOTE — Telephone Encounter (Signed)
 Source  Michael Hickman (Patient)   Subject  Michael Hickman (Patient)   Topic  Clinical - Medication Question    Communication  Reason for CRM: Patient would like PCP to prescribe/renew colchicine  for his gout flare ups. He just likes keeping this medication on hand in case he has a flare up. Agent did not locate this medication on med list, but patient stated that PCP has prescribed it in the past.    Pt has hx of gout flare. Last refill sent 01/05/24 but pt reported not taking on 5/15 so medication was removed from list. One refill sent to pharmacy.

## 2025-02-10 ENCOUNTER — Ambulatory Visit: Admitting: Family Medicine
